# Patient Record
Sex: Male | Born: 1955 | Race: White | Hispanic: No | Marital: Married | Smoking: Former smoker
Health system: Southern US, Community
[De-identification: ages and names within clinical notes are randomized; demographics above are authoritative.]

## PROBLEM LIST (undated history)

## (undated) DIAGNOSIS — D649 Anemia, unspecified: Secondary | ICD-10-CM

## (undated) DIAGNOSIS — T8859XA Other complications of anesthesia, initial encounter: Secondary | ICD-10-CM

## (undated) DIAGNOSIS — T4145XA Adverse effect of unspecified anesthetic, initial encounter: Secondary | ICD-10-CM

## (undated) DIAGNOSIS — C801 Malignant (primary) neoplasm, unspecified: Secondary | ICD-10-CM

## (undated) DIAGNOSIS — I609 Nontraumatic subarachnoid hemorrhage, unspecified: Secondary | ICD-10-CM

## (undated) DIAGNOSIS — M1990 Unspecified osteoarthritis, unspecified site: Secondary | ICD-10-CM

## (undated) DIAGNOSIS — R112 Nausea with vomiting, unspecified: Secondary | ICD-10-CM

## (undated) DIAGNOSIS — E785 Hyperlipidemia, unspecified: Secondary | ICD-10-CM

## (undated) DIAGNOSIS — Z9889 Other specified postprocedural states: Secondary | ICD-10-CM

## (undated) DIAGNOSIS — K219 Gastro-esophageal reflux disease without esophagitis: Secondary | ICD-10-CM

## (undated) DIAGNOSIS — Z8489 Family history of other specified conditions: Secondary | ICD-10-CM

## (undated) DIAGNOSIS — R42 Dizziness and giddiness: Secondary | ICD-10-CM

## (undated) DIAGNOSIS — E119 Type 2 diabetes mellitus without complications: Secondary | ICD-10-CM

## (undated) HISTORY — PX: COLONOSCOPY: SHX174

## (undated) HISTORY — PX: MENISCUS REPAIR: SHX5179

## (undated) HISTORY — DX: Hyperlipidemia, unspecified: E78.5

## (undated) HISTORY — DX: Type 2 diabetes mellitus without complications: E11.9

---

## 2004-01-10 HISTORY — PX: KNEE ARTHROSCOPY: SUR90

## 2010-05-20 ENCOUNTER — Ambulatory Visit: Payer: Self-pay | Admitting: Gastroenterology

## 2010-05-21 LAB — PATHOLOGY REPORT

## 2013-11-11 HISTORY — PX: KNEE ARTHROSCOPY: SUR90

## 2016-07-14 DIAGNOSIS — C801 Malignant (primary) neoplasm, unspecified: Secondary | ICD-10-CM

## 2016-07-14 HISTORY — DX: Malignant (primary) neoplasm, unspecified: C80.1

## 2016-10-13 ENCOUNTER — Ambulatory Visit: Payer: BC Managed Care – PPO | Admitting: Urology

## 2016-10-13 ENCOUNTER — Encounter: Payer: Self-pay | Admitting: Urology

## 2016-10-13 VITALS — BP 134/78 | HR 54 | Ht 66.0 in | Wt 189.3 lb

## 2016-10-13 DIAGNOSIS — R972 Elevated prostate specific antigen [PSA]: Secondary | ICD-10-CM

## 2016-10-13 MED ORDER — SULFAMETHOXAZOLE-TRIMETHOPRIM 800-160 MG PO TABS: 1.0000 | ORAL_TABLET | Freq: Two times a day (BID) | ORAL | 0 refills | Status: AC

## 2016-10-13 NOTE — Progress Notes (Signed)
   10/13/2016 6:02 AM   Keith Holland 01/19/1956 [id number]  Referring provider: Ezequiel Kayser, MD [facility] [place], [place] [address]  CC: new patient elevated PSA  HPI:  1. Elevated PSA - PSA 9.52 09/2016 by PCP labs. No others avail for comparison. Pt's father with prostate cancer. DRE 09/2016 30gm with diffuse Rt induration, not fixed.    PMH sig for IDDM2, knee scope, SAH (spontaneous, no deficits). 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 His PCP is Ezequiel Kayser MD with [facility].  Today " [name] " is seen as new patient for above. He is referred by Dr. Raechel Ache.    PMH: IDDM2 SAH  Surgical History: Knee scope  Home Medications:  Allergies as of 10/13/2016   Not on File     Medication List    as of 10/13/2016  6:02 AM   You have not been prescribed any medications.     Allergies: Allergies not on file  Family History: No family history on file.  Social History:  has no tobacco, alcohol, and drug history on file.   Review of Systems  Gastrointestinal (upper)  : Negative for upper GI symptoms  Gastrointestinal (lower) : Negative for lower GI symptoms  Constitutional : Negative for symptoms  Skin: Negative for skin symptoms  Eyes: Negative for eye symptoms  Ear/Nose/Throat : Negative for Ear/Nose/Throat symptoms  Hematologic/Lymphatic: Negative for Hematologic/Lymphatic symptoms  Cardiovascular : Negative for cardiovascular symptoms  Respiratory : Negative for respiratory symptoms  Endocrine: Negative for endocrine symptoms  Musculoskeletal: Negative for musculoskeletal symptoms  Neurological: Negative for neurological symptoms  Psychologic: Negative for psychiatric symptoms  Physical Exam: There were no vitals taken for this visit.  Constitutional:  Alert and oriented, No acute distress. HEENT: [place] AT, moist mucus membranes.  Trachea midline, no  masses. Cardiovascular: No clubbing, cyanosis, or edema. Respiratory: Normal respiratory effort, no increased work of breathing. GI: Abdomen is soft, nontender, nondistended, no abdominal masses GU: No CVA tenderness. NO scrotal masses. Straight phallus. 30gm prostate with diffuse Right induration. Non-fixed.  Skin: No rashes, bruises or suspicious lesions. Lymph: No cervical or inguinal adenopathy. Neurologic: Grossly intact, no focal deficits, moving all 4 extremities. Psychiatric: Normal mood and affect.  Laboratory Data: No results found for: WBC, HGB, HCT, MCV, PLT  No results found for: CREATININE  No results found for: PSA  No results found for: TESTOSTERONE  No results found for: HGBA1C  Urinalysis No results found for: COLORURINE, APPEARANCEUR, LABSPEC, PHURINE, GLUCOSEU, HGBUR, BILIRUBINUR, KETONESUR, PROTEINUR, UROBILINOGEN, NITRITE, LEUKOCYTESUR  Pertinent Imaging: - none  Assessment & Plan:   1. Elevated PSA - significant elevation in younger man with stable comorbidity. His exam is also abnormal. DDX benign and malignant etiologies discussed as well as natural history of prostate cancer. Management options including serial labs, genetic tests, imaging, biopsy (only test that can DX caner) discussed in order of increasing agressiveness. He opts for biopsy next available and I agree. Risks, benefits, expected peri-BX course discussed. Bactrim peri-BX RX'd today.    Alexis Frock, [facility] [address], [place], [place] [address] [phone]

## 2016-11-11 ENCOUNTER — Other Ambulatory Visit: Payer: Self-pay | Admitting: Urology

## 2016-11-11 ENCOUNTER — Encounter: Payer: Self-pay | Admitting: Urology

## 2016-11-11 ENCOUNTER — Ambulatory Visit: Payer: BC Managed Care – PPO | Admitting: Urology

## 2016-11-11 VITALS — BP 138/76 | HR 63 | Ht 66.0 in | Wt 187.2 lb

## 2016-11-11 DIAGNOSIS — R972 Elevated prostate specific antigen [PSA]: Secondary | ICD-10-CM

## 2016-11-11 MED ORDER — GENTAMICIN SULFATE 40 MG/ML IJ SOLN
80.0000 mg | Freq: Once | INTRAMUSCULAR | Status: AC
  Administered 2016-11-11: 80 mg via INTRAMUSCULAR

## 2016-11-11 NOTE — Addendum Note (Signed)
Addended by: Wilson Singer on: 11/11/2016 09:28 AM   Modules accepted: Orders

## 2016-11-11 NOTE — Progress Notes (Signed)
11/11/2016 6:02 AM   Venia Carbon Knaggs 03-25-1956 [id number]  Referring provider: Ezequiel Kayser, MD [facility] [place], [place] [address]  CC: Prostate Biopsy  HPI:  1. Elevated PSA - PSA 9.52 09/2016 by PCP labs. No others avail for comparison. Pt's father with prostate cancer. DRE 09/2016 30gm with diffuse Rt induration, not fixed. ==> TRUS Biopsy 10/2016 23 mL w/o median lobe.Marland Kitchen   PMH sig for IDDM2, knee scope, SAH (spontaneous, no deficits). 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 His PCP is Ezequiel Kayser MD with [facility].  Today " [name] " is seen to proceed with prostate biopsy. He took ABX and enema as RX'd.    PMH: Past Medical History:  Diagnosis Date  . Diabetes mellitus without complication ([place])   . Hyperlipidemia     Surgical History: Past Surgical History:  Procedure Laterality Date  . MENISCUS REPAIR      Home Medications:  Allergies as of 11/11/2016   No Known Allergies     Medication List       Accurate as of 11/11/16  6:02 AM. Always use your most recent med list.          accu-chek multiclix lancets Use as instructed two times daily   ciclopirox 8 % solution Commonly known as:  PENLAC Apply topically.   COOL BLOOD GLUCOSE TEST STRIPS VI Use 2 (two) times daily. Use as instructed.   FARXIGA 10 MG Tabs tablet Generic drug:  dapagliflozin propanediol Take by mouth.   FIFTY50 PEN NEEDLES 31G X 8 MM Misc Generic drug:  Insulin Pen Needle Use as directed. Use one daily.   metFORMIN 500 MG 24 hr tablet Commonly known as:  GLUCOPHAGE-XR Take by mouth.   naproxen sodium 220 MG tablet Commonly known as:  ANAPROX Take by mouth.   omeprazole 20 MG capsule Commonly known as:  PRILOSEC Take by mouth.   pioglitazone 30 MG tablet Commonly known as:  ACTOS Take by mouth.   simvastatin 40 MG tablet Commonly known as:  ZOCOR Take by mouth.   TOUJEO SOLOSTAR 300 UNIT/ML  Sopn Generic drug:  Insulin Glargine Inject into the skin.       Allergies: No Known Allergies  Family History: Family History  Problem Relation Age of Onset  . Prostate cancer Father   . Stroke Father   . Stroke Mother   . Bladder Cancer Neg Hx   . Kidney cancer Neg Hx     Social History:  reports that he has never smoked. He has quit using smokeless tobacco. His smokeless tobacco use included Chew. He reports that he drinks alcohol. He reports that he does not use drugs.      Review of Systems  Gastrointestinal (upper)  : Negative for upper GI symptoms  Gastrointestinal (lower) : Negative for lower GI symptoms  Constitutional : Negative for symptoms  Skin: Negative for skin symptoms  Eyes: Negative for eye symptoms  Ear/Nose/Throat : Negative for Ear/Nose/Throat symptoms  Hematologic/Lymphatic: Negative for Hematologic/Lymphatic symptoms  Cardiovascular : Negative for cardiovascular symptoms  Respiratory : Negative for respiratory symptoms  Endocrine: Negative for endocrine symptoms  Musculoskeletal: Negative for musculoskeletal symptoms  Neurological: Negative for neurological symptoms  Psychologic: Negative for psychiatric symptoms  Physical Exam: There were no vitals taken for this visit.  Constitutional:  Alert and oriented, No acute distress. HEENT: [place] AT, moist mucus membranes.  Trachea midline, no masses. Cardiovascular: No clubbing, cyanosis, or  edema. Respiratory: Normal respiratory effort, no increased work of breathing. GI: Abdomen is soft, nontender, nondistended, no abdominal masses GU: No CVA tenderness.  Skin: No rashes, bruises or suspicious lesions. Lymph: No cervical or inguinal adenopathy. Neurologic: Grossly intact, no focal deficits, moving all 4 extremities. Psychiatric: Normal mood and affect.   Prostate Biopsy Procedure   Informed consent was obtained after discussing risks/benefits of the procedure.  A time  out was performed to ensure correct patient identity.  Pre-Procedure: - Last PSA Level: No results found for: PSA - Gentamicin given prophylactically - Levaquin 500 mg administered PO -Transrectal Ultrasound performed revealing a 23 gm prostate -No significant hypoechoic or median lobe noted  Procedure: - Prostate block performed using 10 cc 1% lidocaine and biopsies taken from sextant areas, a total of 12 under ultrasound guidance.  Post-Procedure: - Patient tolerated the procedure well - He was counseled to seek immediate medical attention if experiences any severe pain, significant bleeding, or fevers - Return in one week to discuss biopsy results   Assessment & Plan:   1. Elevated PSA - s/p BX today as per above.Warned to contact MD for new fevers, inability to void, or large bleeding with dizziness.  Alexis Frock, [facility] [address], [place], [place] [address] [phone]

## 2016-11-15 LAB — PATHOLOGY REPORT

## 2016-11-17 ENCOUNTER — Other Ambulatory Visit: Payer: Self-pay | Admitting: Urology

## 2016-11-30 DIAGNOSIS — C61 Malignant neoplasm of prostate: Secondary | ICD-10-CM | POA: Insufficient documentation

## 2016-11-30 NOTE — Progress Notes (Signed)
12/02/2016 1:42 PM   Keith Holland [birth year] [id number]  Referring provider: Ezequiel Kayser, MD [place] [facility] [place], [place] [address]  CC: Discuss New Prostate Cancer  HPI:  1. Moderate Risk Prostate Cancer - Gleason 3+3=6 in up to 10% of LMM, RMB, RMA by TRUS biopsy 11/2016 on eval PSA 9.52 and Rt induration (T2a).  Pt's father with prostate cancer. DRE 09/2016 30gm with diffuse Rt induration, not fixed. ==> TRUS Biopsy 10/2016 23 mL w/o median lobe.  PMH sig for IDDM2, knee scope, SAH (spontaneous, no deficits). 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 His PCP is Keith Kayser MD with [facility].  Today " Keith Holland " is seen in f/u above and discuss new prostate cancer.    PMH: Past Medical History:  Diagnosis Date  . Diabetes mellitus without complication ([place])   . Hyperlipidemia     Surgical History: Past Surgical History:  Procedure Laterality Date  . MENISCUS REPAIR      Home Medications:  Allergies as of 12/02/2016   No Known Allergies     Medication List       Accurate as of 11/30/16  1:42 PM. Always use your most recent med list.          accu-chek multiclix lancets Use as instructed two times daily   ciclopirox 8 % solution Commonly known as:  PENLAC Apply topically.   COOL BLOOD GLUCOSE TEST STRIPS VI Use 2 (two) times daily. Use as instructed.   FARXIGA 10 MG Tabs tablet Generic drug:  dapagliflozin propanediol Take by mouth.   FIFTY50 PEN NEEDLES 31G X 8 MM Misc Generic drug:  Insulin Pen Needle Use as directed. Use one daily.   metFORMIN 500 MG 24 hr tablet Commonly known as:  GLUCOPHAGE-XR Take by mouth.   naproxen sodium 220 MG tablet Commonly known as:  ANAPROX Take by mouth.   omeprazole 20 MG capsule Commonly known as:  PRILOSEC Take by mouth.   pioglitazone 30 MG tablet Commonly known as:  ACTOS Take by mouth.   simvastatin 40 MG tablet Commonly known as:   ZOCOR Take by mouth.   TOUJEO SOLOSTAR 300 UNIT/ML Sopn Generic drug:  Insulin Glargine Inject into the skin.       Allergies: No Known Allergies  Family History: Family History  Problem Relation Age of Onset  . Prostate cancer Father   . Stroke Father   . Stroke Mother   . Bladder Cancer Neg Hx   . Kidney cancer Neg Hx     Social History:  reports that he has never smoked. He has quit using smokeless tobacco. His smokeless tobacco use included Chew. He reports that he drinks alcohol. He reports that he does not use drugs.      Review of Systems  Gastrointestinal (upper)  : Negative for upper GI symptoms  Gastrointestinal (lower) : Negative for lower GI symptoms  Constitutional : Negative for symptoms  Skin: Negative for skin symptoms  Eyes: Negative for eye symptoms  Ear/Nose/Throat : Negative for Ear/Nose/Throat symptoms  Hematologic/Lymphatic: Negative for Hematologic/Lymphatic symptoms  Cardiovascular : Negative for cardiovascular symptoms  Respiratory : Negative for respiratory symptoms  Endocrine: Negative for endocrine symptoms  Musculoskeletal: Negative for musculoskeletal symptoms  Neurological: Negative for neurological symptoms  Psychologic: Negative for psychiatric symptoms  Physical Exam: There were no vitals taken for this visit.  Constitutional:  Alert and oriented, No acute distress. HEENT: [place] AT, moist mucus  membranes.  Trachea midline, no masses. Cardiovascular: No clubbing, cyanosis, or edema. Respiratory: Normal respiratory effort, no increased work of breathing. GI: Abdomen is soft, nontender, nondistended, no abdominal masses GU: No CVA tenderness.  Skin: No rashes, bruises or suspicious lesions. Lymph: No cervical or inguinal adenopathy. Neurologic: Grossly intact, no focal deficits, moving all 4 extremities. Psychiatric: Normal mood and affect.      Assessment & Plan:   1. Moderate Risk Prostate Cancer -  Discussed natural history of moderate risk disease with real possibility of progression to symptomatic disease over years-decades. Management options including surveillance, radiaion, surgery discussed in detail including frank discussion of expected urianry and sexual side effects with any curative intent therapy. Offered and encouraged rad-onc discussion as well, as may be candidate for brachy by risk and prostate size criteria, though I usually favor for men >65 given risk of secondary malignancy.  He opts for rad-onc visit and rediscuss. I agree. He would likely do well with surgery or radiation.    Keith [place], [facility] [address], [place], [place] [address] [phone]

## 2016-12-02 ENCOUNTER — Ambulatory Visit: Payer: BC Managed Care – PPO | Admitting: Urology

## 2016-12-02 ENCOUNTER — Encounter: Payer: Self-pay | Admitting: Urology

## 2016-12-02 VITALS — BP 132/78 | HR 62 | Ht 66.0 in | Wt 190.6 lb

## 2016-12-02 DIAGNOSIS — C61 Malignant neoplasm of prostate: Secondary | ICD-10-CM

## 2016-12-04 ENCOUNTER — Telehealth: Payer: Self-pay | Admitting: Urology

## 2016-12-04 NOTE — Telephone Encounter (Signed)
Received a call from [facility] at the cancer center and she spoke with the patient and he declined an app with them stating that he wanted to proceed with surgery instead.  Sharyn Lull

## 2016-12-15 NOTE — Progress Notes (Signed)
12/16/2016 9:54 AM   Keith Holland 1955/11/23 [id number]  Referring provider: Ezequiel Kayser, MD [facility] [place], [place] [address]  CC: [place] Management of Prostate Cancer  HPI:  1. Moderate Risk Prostate Cancer - Gleason 3+3=6 in up to 10% of LMM, RMB, RMA by TRUS biopsy 11/2016 on eval PSA 9.52 and Rt induration (T2a).  Pt's father with prostate cancer. DRE 09/2016 30gm with diffuse Rt induration, not fixed. ==> TRUS Biopsy 10/2016 23 mL w/o median lobe.  PMH sig for IDDM2, knee scope, SAH (spontaneous, no deficits). 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 His PCP is Ezequiel Kayser MD with [facility].  Today " [name] " is seen in f/u above. We have previously discussed primary managmetn options for his prostate cancer in detail.    PMH: Past Medical History:  Diagnosis Date  . Diabetes mellitus without complication ([place])   . Hyperlipidemia     Surgical History: Past Surgical History:  Procedure Laterality Date  . MENISCUS REPAIR     x 3    Home Medications:  Allergies as of 12/16/2016   No Known Allergies     Medication List       Accurate as of 12/15/16  9:54 AM. Always use your most recent med list.          accu-chek multiclix lancets Use as instructed two times daily   ciclopirox 8 % solution Commonly known as:  PENLAC Apply topically.   COOL BLOOD GLUCOSE TEST STRIPS VI Use 2 (two) times daily. Use as instructed.   etodolac 500 MG tablet Commonly known as:  LODINE etodolac 500 mg tablet   FARXIGA 10 MG Tabs tablet Generic drug:  dapagliflozin propanediol Take by mouth.   FIFTY50 PEN NEEDLES 31G X 8 MM Misc Generic drug:  Insulin Pen Needle Use as directed. Use one daily.   glipiZIDE 10 MG 24 hr tablet Commonly known as:  GLUCOTROL XL glipizide ER 10 mg tablet, extended release 24 hr   INVOKANA 300 MG Tabs tablet Generic drug:  canagliflozin Invokana 300 mg tablet     metFORMIN 500 MG 24 hr tablet Commonly known as:  GLUCOPHAGE-XR Take by mouth.   naproxen sodium 220 MG tablet Commonly known as:  ANAPROX Take by mouth.   omeprazole 20 MG capsule Commonly known as:  PRILOSEC Take by mouth.   ONGLYZA 5 MG Tabs tablet Generic drug:  saxagliptin HCl Onglyza 5 mg tablet   pioglitazone 30 MG tablet Commonly known as:  ACTOS Take by mouth.   simvastatin 40 MG tablet Commonly known as:  ZOCOR Take by mouth.   TOUJEO SOLOSTAR 300 UNIT/ML Sopn Generic drug:  Insulin Glargine Inject into the skin.   traMADol 50 MG tablet Commonly known as:  ULTRAM tramadol 50 mg tablet  Take 1 tablet every 6 hours by oral route as needed.   VICODIN 5-300 MG Tabs Generic drug:  Hydrocodone-Acetaminophen Vicodin 5 mg-300 mg tablet  Take 1 tablet every 4-6 hours by oral route as needed.       Allergies: No Known Allergies  Family History: Family History  Problem Relation Age of Onset  . Prostate cancer Father   . Stroke Father   . Stroke Mother   . Bladder Cancer Neg Hx   . Kidney cancer Neg Hx     Social History:  reports that he has never smoked. He has quit using smokeless tobacco. His smokeless tobacco use  included Chew. He reports that he drinks alcohol. He reports that he does not use drugs.      Review of Systems  Gastrointestinal (upper)  : Negative for upper GI symptoms  Gastrointestinal (lower) : Negative for lower GI symptoms  Constitutional : Negative for symptoms  Skin: Negative for skin symptoms  Eyes: Negative for eye symptoms  Ear/Nose/Throat : Negative for Ear/Nose/Throat symptoms  Hematologic/Lymphatic: Negative for Hematologic/Lymphatic symptoms  Cardiovascular : Negative for cardiovascular symptoms  Respiratory : Negative for respiratory symptoms  Endocrine: Negative for endocrine symptoms  Musculoskeletal: Negative for musculoskeletal symptoms  Neurological: Negative for neurological  symptoms  Psychologic: Negative for psychiatric symptoms  Physical Exam: There were no vitals taken for this visit.  Constitutional:  Alert and oriented, No acute distress. HEENT: [place] AT, moist mucus membranes.  Trachea midline, no masses. Cardiovascular: No clubbing, cyanosis, or edema. Respiratory: Normal respiratory effort, no increased work of breathing. GI: Abdomen is soft, nontender, nondistended, no abdominal masses GU: No CVA tenderness.  Skin: No rashes, bruises or suspicious lesions. Lymph: No cervical or inguinal adenopathy. Neurologic: Grossly intact, no focal deficits, moving all 4 extremities. Psychiatric: Normal mood and affect.      Assessment & Plan:   1. Moderate Risk Prostate Cancer -  Pt opts for curative intent prostatectomy. I agree given young age and sell controlled comorbidity. Risks, benefits, expected peri-op course discussed. Also frankly discussed expected urinary and sexual function changes that can improve with time but not back to baseline.  PT eval prior to hasten continence recovery.    Alexis Frock, [facility] [address] [place], [place] [address] [phone]

## 2016-12-16 ENCOUNTER — Ambulatory Visit: Payer: BC Managed Care – PPO | Admitting: Urology

## 2016-12-16 ENCOUNTER — Encounter: Payer: Self-pay | Admitting: Urology

## 2016-12-16 VITALS — BP 135/74 | HR 62 | Ht 66.0 in | Wt 193.3 lb

## 2016-12-16 DIAGNOSIS — C61 Malignant neoplasm of prostate: Secondary | ICD-10-CM | POA: Diagnosis not present

## 2016-12-17 ENCOUNTER — Institutional Professional Consult (permissible substitution): Payer: Self-pay | Admitting: Radiation Oncology

## 2016-12-17 ENCOUNTER — Telehealth: Payer: Self-pay | Admitting: Radiology

## 2016-12-17 ENCOUNTER — Other Ambulatory Visit: Payer: Self-pay | Admitting: Radiology

## 2016-12-17 DIAGNOSIS — C61 Malignant neoplasm of prostate: Secondary | ICD-10-CM

## 2016-12-17 NOTE — Telephone Encounter (Signed)
Notified pt of prostatectomy scheduled with Dr Erlene Quan on 12/29/16, pre-admit testing appt on 12/18/16 @1 :00 & to call Friday prior to surgery for arrival time to SDS. Advised pt to be npo after mn prior to surgery. Questions answered to pt's satisfaction. No further questions at this time. Pt voices understanding.

## 2016-12-18 ENCOUNTER — Encounter
Admission: RE | Admit: 2016-12-18 | Discharge: 2016-12-18 | Disposition: A | Discharge status: Home / Self Care | Payer: BC Managed Care – PPO | Source: Ambulatory Visit | Attending: Urology | Admitting: Urology

## 2016-12-18 DIAGNOSIS — Z01812 Encounter for preprocedural laboratory examination: Secondary | ICD-10-CM | POA: Insufficient documentation

## 2016-12-18 DIAGNOSIS — C61 Malignant neoplasm of prostate: Secondary | ICD-10-CM | POA: Diagnosis not present

## 2016-12-18 DIAGNOSIS — E119 Type 2 diabetes mellitus without complications: Secondary | ICD-10-CM | POA: Diagnosis not present

## 2016-12-18 DIAGNOSIS — Z01818 Encounter for other preprocedural examination: Secondary | ICD-10-CM | POA: Diagnosis not present

## 2016-12-18 DIAGNOSIS — Z0183 Encounter for blood typing: Secondary | ICD-10-CM | POA: Insufficient documentation

## 2016-12-18 HISTORY — DX: Nausea with vomiting, unspecified: R11.2

## 2016-12-18 HISTORY — DX: Other complications of anesthesia, initial encounter: T88.59XA

## 2016-12-18 HISTORY — DX: Adverse effect of unspecified anesthetic, initial encounter: T41.45XA

## 2016-12-18 HISTORY — DX: Dizziness and giddiness: R42

## 2016-12-18 HISTORY — DX: Nontraumatic subarachnoid hemorrhage, unspecified: I60.9

## 2016-12-18 HISTORY — DX: Gastro-esophageal reflux disease without esophagitis: K21.9

## 2016-12-18 HISTORY — DX: Unspecified osteoarthritis, unspecified site: M19.90

## 2016-12-18 HISTORY — DX: Other specified postprocedural states: Z98.890

## 2016-12-18 LAB — CBC
HCT: 44.6 % (ref 40.0–52.0)
Hemoglobin: 14.7 g/dL (ref 13.0–18.0)
MCH: 30.2 pg (ref 26.0–34.0)
MCHC: 32.9 g/dL (ref 32.0–36.0)
MCV: 91.8 fL (ref 80.0–100.0)
PLATELETS: 207 10*3/uL (ref 150–440)
RBC: 4.86 MIL/uL (ref 4.40–5.90)
RDW: 13.8 % (ref 11.5–14.5)
WBC: 5.6 10*3/uL (ref 3.8–10.6)

## 2016-12-18 LAB — TYPE AND SCREEN
ABO/RH(D): O POS
Antibody Screen: NEGATIVE

## 2016-12-18 LAB — URINALYSIS, ROUTINE W REFLEX MICROSCOPIC
Bacteria, UA: NONE SEEN
Bilirubin Urine: NEGATIVE
Hgb urine dipstick: NEGATIVE
KETONES UR: NEGATIVE mg/dL
LEUKOCYTES UA: NEGATIVE
Nitrite: NEGATIVE
PROTEIN: NEGATIVE mg/dL
SQUAMOUS EPITHELIAL / LPF: NONE SEEN
Specific Gravity, Urine: 1.024 (ref 1.005–1.030)
pH: 5 (ref 5.0–8.0)

## 2016-12-18 LAB — BASIC METABOLIC PANEL
Anion gap: 7 (ref 5–15)
BUN: 21 mg/dL — AB (ref 6–20)
CALCIUM: 8.9 mg/dL (ref 8.9–10.3)
CHLORIDE: 104 mmol/L (ref 101–111)
CO2: 25 mmol/L (ref 22–32)
CREATININE: 0.77 mg/dL (ref 0.61–1.24)
GFR calc Af Amer: >60 mL/min (ref >60)
GFR calc non Af Amer: >60 mL/min (ref >60)
Glucose, Bld: 196 mg/dL — ABNORMAL HIGH (ref 65–99)
Potassium: 4.1 mmol/L (ref 3.5–5.1)
Sodium: 136 mmol/L (ref 135–145)

## 2016-12-18 NOTE — Pre-Procedure Instructions (Signed)
PATIENT STATES MAIN CONCERN IS SEVERE N/V POST OP

## 2016-12-18 NOTE — Patient Instructions (Addendum)
  Your procedure is scheduled on: 12/29/16 Report to Same Day Surgery 2nd floor medical mall [facility] Entrance-take elevator on left to 2nd floor.  Check in with surgery information desk.) To find out your arrival time please call [phone] between 1PM - 3PM on 12/26/16  Remember: Instructions that are not followed completely may result in serious medical risk, up to and including death, or upon the discretion of your surgeon and anesthesiologist your surgery may need to be rescheduled.    _x___ 1. Do not eat food or drink liquids after midnight. No gum chewing or                              hard candies.     __x__ 2. No Alcohol for 24 hours before or after surgery.   __x__3. No Smoking for 24 prior to surgery.   ____  4. Bring all medications with you on the day of surgery if instructed.    __x__ 5. Notify your doctor if there is any change in your medical condition     (cold, fever, infections).     Do not wear jewelry, make-up, hairpins, clips or nail polish.  Do not wear lotions, powders, or perfumes. You may wear deodorant.  Do not shave 48 hours prior to surgery. Men may shave face and neck.  Do not bring valuables to the hospital.    [facility] is not responsible for any belongings or valuables.               Contacts, dentures or bridgework may not be worn into surgery.  Leave your suitcase in the car. After surgery it may be brought to your room.  For patients admitted to the hospital, discharge time is determined by your                       treatment team.   Patients discharged the day of surgery will not be allowed to drive home.  You will need someone to drive you home and stay with you the night of your procedure.    Please read over the following fact sheets that you were given:   [facility] Preparing for Surgery   _x___ Take anti-hypertensive (unless it includes a diuretic), cardiac, seizure, asthma,     anti-reflux and psychiatric medicines. These  include:  1. PRILOSEC AT BEDTIME AND MORNING OF SURGERY  2.  3.  4.  5.  6.  ____Fleets enema or Magnesium Citrate as directed.   ____ Use CHG Soap or sage wipes as directed on instruction sheet   ____ Use inhalers on the day of surgery and bring to hospital day of surgery  _X___ Stop Metformin and Janumet 2 days prior to surgery.    __X__ Take 1/2 of usual insulin dose the night before surgery and none on the morning     surgery.   _x___ Follow recommendations from Cardiologist, Pulmonologist or PCP regarding stopping Aspirin, Coumadin, Pllavix ,Eliquis, Effient, or Pradaxa, and Pletal.  X____Stop Anti-inflammatories such as Advil, Aleve, Ibuprofen, Motrin, Naproxen, Naprosyn, Goodies powders or aspirin products. OK to take Tylenol and Celebrex.   _x___ Stop supplements until after surgery.  But may continue Vitamin D, Vitamin B, and multivitamin.   ____ Bring C-Pap to the hospital.

## 2016-12-19 LAB — URINE CULTURE: CULTURE: NO GROWTH

## 2016-12-22 ENCOUNTER — Ambulatory Visit: Payer: BC Managed Care – PPO | Attending: Urology | Admitting: Physical Therapy

## 2016-12-22 DIAGNOSIS — R278 Other lack of coordination: Secondary | ICD-10-CM | POA: Insufficient documentation

## 2016-12-22 DIAGNOSIS — R2689 Other abnormalities of gait and mobility: Secondary | ICD-10-CM | POA: Insufficient documentation

## 2016-12-22 NOTE — Patient Instructions (Addendum)
    Decrease bladder irritants ( soft drink, coffee), start drink 2 _8 fl oz of water  __________   Maintain spinal flexibility for low back pain and arthritis  Arm swings without moving the hips / knees Side bend  Mini squat, chest lift,   3 x aside side every morning , mid after noon at work     __________   Functional activities to decrease load on pelvic floor mm    Proper body mechanics with getting out of a chair to decrease strain  on back &pelvic floor   Avoid holding your breath when Getting out of the chair:  Scoot to front part of chair Heels behind feet, feet are hip width apart, nose over toes  Inhale like you are smelling roses Exhale to stand     Up from the floor from all fours, buttock is up in the air, crawl feet forward and crawl hand on thighs to stand up      ___________   PELVIC FLOOR / KEGEL EXERCISES  TO DO [place] and can start after CATHETER IS REMOVED    Pelvic floor/ Kegel exercises are used to strengthen the muscles in the base of your pelvis that are responsible for supporting your pelvic organs and preventing urine/feces leakage. Based on your therapist's recommendations, they can be performed while standing, sitting, or lying down.  Make yourself aware of this muscle group by using these cues:  Imagine you are in a crowded room and you feel the need to pass gas. Your response is to pull up and in at the rectum.  Close the rectum. Pull the muscles up inside your body,feeling your scrotum lifting as well . Feel the pelvic floor muscles lift as if you were walking into a cold lake.  Place your hand on top of your pubic bone. Tighten and draw in the muscles around the anal muscles without squeezing the buttock muscles.  Common Errors:  Breath holding: If you are holding your breath, you may be bearing down against your bladder instead of pulling it up. If you belly bulges up while you are squeezing, you are holding  your breath. Be sure to breathe gently in and out while exercising. Counting out loud may help you avoid holding your breath.  Accessory muscle use: You should not see or feel other muscle movement when performing pelvic floor exercises. When done properly, no one can tell that you are performing the exercises. Keep the buttocks, belly and inner thighs relaxed.  Overdoing it: Your muscles can fatigue and stop working for you if you over-exercise. You may actually leak more or feel soreness at the lower abdomen or rectum.  YOUR HOME EXERCISE PROGRAM   LONG HOLDS:  ( wait until next PT session)    SHORT HOLDS: Position: on back reclined or seated    Inhale SMELL SOUP ( less in the chest rising, more in the ribcage expanding)  and then exhale. Then squeeze the muscle.  (Be sure to let belly sink in with exhales and not push outward)  Perform 5 repetitions, 5  Times/day  **ALSO SQUEEZE BEFORE YOUR SNEEZE, COUGH, LAUGH to decrease downward pressure   **ALSO EXHALE BEFORE YOU RISE AGAINST GRAVITY (lifting, sit to stand, from squat to stand)

## 2016-12-24 NOTE — Addendum Note (Signed)
Addended by: Jerl Mina on: 12/24/2016 12:59 PM   Modules accepted: Orders

## 2016-12-24 NOTE — Therapy (Addendum)
[place] MAIN [facility] SERVICES [address] [place], [place], [address] Phone: [phone]   Fax:  [phone]  Physical Therapy Evaluation  Patient Details  Name: Keith Holland MRN: [id number] Date of Birth: 07-02-1956 Referring Provider: Tresa Moore   Encounter Date: 12/22/2016      PT End of Session - 12/23/16 2357    Visit Number 1   Number of Visits 12   Date for PT Re-Evaluation 03/16/17   PT Start Time 1010   PT Stop Time 4008   PT Time Calculation (min) 55 min   Activity Tolerance Patient tolerated treatment well;No increased pain   Behavior During Therapy WFL for tasks assessed/performed      Past Medical History:  Diagnosis Date  . Arthritis   . Complication of anesthesia    severe n/v  . Diabetes mellitus without complication ([place])   . GERD (gastroesophageal reflux disease)   . Hyperlipidemia   . PONV (postoperative nausea and vomiting)   . SAH (subarachnoid hemorrhage) ([place])    spontaneous 2000 n surgery  . Vertigo    since childhood if on back long will get vertigo     Past Surgical History:  Procedure Laterality Date  . MENISCUS REPAIR     x 3    There were no vitals filed for this visit.       Subjective Assessment - 12/23/16 2303    Subjective 1) Urinary Sx: Pt is scheduled for prostate schedule on 12/29/16. Pt Pt has had some difficulty with initaiting urination and occasionally, a weak flow for a couple of years. Denied leakage. Daily water intake: very little water, 36-  48 fl oz of Diet pepsi and tea combined,  16 floz  coffee,  2) sexual dysfunction: Issues with erection  3) CLBP 2/2 falling off the ladder  2003 ( compression in L1),  lifting with a twist a heavy load ( 1980s).  Pt has under gone PT. for his back. Pt has not been able to get rid of his LBP. upper lumbar with a recent pulled mm on the R lowe back 8 months.  Currently 4-5/10. At worst 8/10,  sometimes radiating pain down LLE back of leg.  Pt is  an Clinical biochemist , climbs ladders, get from stand to floor, minior lifting.                 [facility] PT Assessment - 12/23/16 2331      Assessment   Medical Diagnosis prostate cancer   Referring Provider Manny      Precautions   Precautions None     Restrictions   Weight Bearing Restrictions No     Balance Screen   Has the patient fallen in the past 6 months No     Observation/Other Assessments   Observations slumped sitting     Coordination   Gross Motor Movements are Fluid and Coordinated --  lumbopelvic perturbation with leg movements in hooklying   Fine Motor Movements are Fluid and Coordinated --  chest breathing     Squat   Comments knee anterior to toes     Floor to Stand   Comments down ward facing dog position to mini squat (pt reported less knee pain)       ROM / Strength   AROM / PROM / Strength --  limited diaphragmatic exursion, spinal hypomobility     Palpation   Spinal mobility increased thoracic /paraspinal mm tensions  Objective measurements completed on examination: See above findings.          [place] Adult PT Treatment/Exercise - 12/23/16 2349      Therapeutic Activites    Therapeutic Activities --  see pt instructions     Neuro Re-ed    Neuro Re-ed Details  see pt instructions                PT Education - 12/23/16 2318    Education provided Yes   Education Details POC, anatomy/physiology, HEP, goals   Person(s) Educated Patient   Methods Explanation;Demonstration;Tactile cues;Verbal cues;Handout   Comprehension Returned demonstration;Verbalized understanding             PT Long Term Goals - 12/23/16 2358      PT LONG TERM GOAL #1   Title Pt will demo increased spinal mobility to optimize diaphragmatic excursion and pelvic floor activation and minimize urinary leakage.    Time 12   Period Weeks   Status achieved     PT LONG TERM GOAL #2   Title Pt will demo proper pelvic floor lengthening and  coordination and 7 quick contractions in supine and seated position to minimize risk for leakage  ( 6/12: 5 reps)    Time 12   Period Weeks   Status New     PT LONG TERM GOAL #3   Title Pt will demo proper body mechanics with floor to stand and sit to stand t/f with less strain on pelvic floor mm in order to perform work tasks    Time 12   Period Weeks    Achieved            Plan - 12/23/16 2357    Clinical Impression Statement  Pt is a 61    yo male who is scheduled for prostatectomy on 6/18 /18. Pt was referred to Pelvic [facility] PT to train his pelvic floor mm to optimize continence post-surgery. Pt presented with limited spinal/ diaphragmatic/ pelvic floor mobility, weak pelvic floor mm, and poor body mechanics which place strain on his pelvic floor. Pt was educated on how to strengthen his mm and how to coordinate his mm during against gravity activities with quick contractions to minimize leakage. Pt demo'd properly. Pt voiced understanding to not perform pelvic floor mm when catheterized post surgery.  Pt would benefit from further PT post -surgery if he presents with urinary issues.         Clinical Presentation Stable   Clinical Decision Making Low   Rehab Potential Good   PT Frequency 1x / week   PT Duration 12 weeks   PT Treatment/Interventions ADLs/Self Care Home Management;Moist Heat;Therapeutic exercise;Therapeutic activities;Functional mobility training;Stair training;Gait training;Neuromuscular re-education;Patient/family education;Manual techniques;Energy conservation   Consulted and Agree with Plan of Care Patient      Patient will benefit from skilled therapeutic intervention in order to improve the following deficits and impairments:  Postural dysfunction, Improper body mechanics, Pain, Hypomobility, Decreased balance, Decreased safety awareness, Decreased activity tolerance, Decreased endurance, Decreased range of motion, Decreased coordination, Decreased  mobility  Visit Diagnosis: Other lack of coordination  Other abnormalities of gait and mobility     Problem List Patient Active Problem List   Diagnosis Date Noted  . Prostate cancer ([place]) 11/30/2016  . Elevated PSA 10/13/2016    Jerl Mina ,PT, DPT, E-RYT  12/24/2016, 12:00 AM  [place] MAIN [facility] SERVICES [address] [place], [place], [address] Phone: [phone]   Fax:  [phone]  Name: Keith Holland MRN: [id number] Date of Birth: 29-Jan-1956

## 2016-12-28 MED ORDER — CEFAZOLIN SODIUM-DEXTROSE 2-4 GM/100ML-% IV SOLN
2.0000 g | INTRAVENOUS | Status: AC
  Administered 2016-12-29: 2 g via INTRAVENOUS

## 2016-12-29 ENCOUNTER — Encounter: Payer: Self-pay | Admitting: *Deleted

## 2016-12-29 ENCOUNTER — Ambulatory Visit: Payer: BC Managed Care – PPO | Admitting: Anesthesiology

## 2016-12-29 ENCOUNTER — Observation Stay
Admission: RE | Admit: 2016-12-29 | Discharge: 2016-12-30 | Disposition: A | Discharge status: Home / Self Care | Payer: BC Managed Care – PPO | Source: Ambulatory Visit | Attending: Urology | Admitting: Urology

## 2016-12-29 ENCOUNTER — Encounter
Admission: RE | Disposition: A | Discharge status: Home / Self Care | Payer: Self-pay | Source: Ambulatory Visit | Attending: Urology

## 2016-12-29 DIAGNOSIS — Z823 Family history of stroke: Secondary | ICD-10-CM | POA: Insufficient documentation

## 2016-12-29 DIAGNOSIS — Z8679 Personal history of other diseases of the circulatory system: Secondary | ICD-10-CM | POA: Diagnosis not present

## 2016-12-29 DIAGNOSIS — E785 Hyperlipidemia, unspecified: Secondary | ICD-10-CM | POA: Insufficient documentation

## 2016-12-29 DIAGNOSIS — E119 Type 2 diabetes mellitus without complications: Secondary | ICD-10-CM | POA: Insufficient documentation

## 2016-12-29 DIAGNOSIS — Z87891 Personal history of nicotine dependence: Secondary | ICD-10-CM | POA: Insufficient documentation

## 2016-12-29 DIAGNOSIS — Z79899 Other long term (current) drug therapy: Secondary | ICD-10-CM | POA: Insufficient documentation

## 2016-12-29 DIAGNOSIS — Z794 Long term (current) use of insulin: Secondary | ICD-10-CM | POA: Diagnosis not present

## 2016-12-29 DIAGNOSIS — C61 Malignant neoplasm of prostate: Secondary | ICD-10-CM | POA: Diagnosis not present

## 2016-12-29 DIAGNOSIS — Z8042 Family history of malignant neoplasm of prostate: Secondary | ICD-10-CM | POA: Insufficient documentation

## 2016-12-29 DIAGNOSIS — Z791 Long term (current) use of non-steroidal anti-inflammatories (NSAID): Secondary | ICD-10-CM | POA: Insufficient documentation

## 2016-12-29 DIAGNOSIS — Z9889 Other specified postprocedural states: Secondary | ICD-10-CM | POA: Diagnosis not present

## 2016-12-29 HISTORY — PX: ROBOT ASSISTED LAPAROSCOPIC RADICAL PROSTATECTOMY: SHX5141

## 2016-12-29 LAB — CBC
HCT: 43.5 % (ref 40.0–52.0)
Hemoglobin: 14.6 g/dL (ref 13.0–18.0)
MCH: 30.7 pg (ref 26.0–34.0)
MCHC: 33.5 g/dL (ref 32.0–36.0)
MCV: 91.6 fL (ref 80.0–100.0)
PLATELETS: 194 10*3/uL (ref 150–440)
RBC: 4.75 MIL/uL (ref 4.40–5.90)
RDW: 13.7 % (ref 11.5–14.5)
WBC: 9.8 10*3/uL (ref 3.8–10.6)

## 2016-12-29 LAB — GLUCOSE, CAPILLARY
GLUCOSE-CAPILLARY: 191 mg/dL — AB (ref 65–99)
GLUCOSE-CAPILLARY: 222 mg/dL — AB (ref 65–99)
Glucose-Capillary: 167 mg/dL — ABNORMAL HIGH (ref 65–99)
Glucose-Capillary: 167 mg/dL — ABNORMAL HIGH (ref 65–99)

## 2016-12-29 LAB — ABO/RH: ABO/RH(D): O POS

## 2016-12-29 SURGERY — ROBOTIC ASSISTED LAPAROSCOPIC RADICAL PROSTATECTOMY
Anesthesia: General | Site: Prostate | Wound class: Clean Contaminated

## 2016-12-29 MED ORDER — ROCURONIUM BROMIDE 50 MG/5ML IV SOLN
INTRAVENOUS | Status: AC
  Filled 2016-12-29: qty 1

## 2016-12-29 MED ORDER — ONDANSETRON HCL 4 MG/2ML IJ SOLN
INTRAMUSCULAR | Status: DC | PRN
Start: 2016-12-29 — End: 2016-12-29
  Administered 2016-12-29: 4 mg via INTRAVENOUS

## 2016-12-29 MED ORDER — FENTANYL CITRATE (PF) 100 MCG/2ML IJ SOLN
25.0000 ug | INTRAMUSCULAR | Status: DC | PRN
  Administered 2016-12-29 (×3): 50 ug via INTRAVENOUS

## 2016-12-29 MED ORDER — DIPHENHYDRAMINE HCL 12.5 MG/5ML PO ELIX: 12.5000 mg | ORAL_SOLUTION | Freq: Four times a day (QID) | ORAL | Status: DC | PRN

## 2016-12-29 MED ORDER — SODIUM CHLORIDE 0.9 % IV BOLUS (SEPSIS)
1000.0000 mL | Freq: Once | INTRAVENOUS | Status: AC
  Administered 2016-12-29: 1000 mL via INTRAVENOUS

## 2016-12-29 MED ORDER — OXYBUTYNIN CHLORIDE 5 MG PO TABS: 5.0000 mg | ORAL_TABLET | Freq: Three times a day (TID) | ORAL | Status: DC | PRN

## 2016-12-29 MED ORDER — INSULIN ASPART 100 UNIT/ML ~~LOC~~ SOLN
4.0000 [IU] | Freq: Three times a day (TID) | SUBCUTANEOUS | Status: DC
Start: 2016-12-29 — End: 2016-12-30
  Administered 2016-12-29: 4 [IU] via SUBCUTANEOUS
  Filled 2016-12-29: qty 1

## 2016-12-29 MED ORDER — OXYCODONE HCL 5 MG/5ML PO SOLN: 5.0000 mg | Freq: Once | ORAL | Status: DC | PRN

## 2016-12-29 MED ORDER — FENTANYL CITRATE (PF) 100 MCG/2ML IJ SOLN
INTRAMUSCULAR | Status: AC
  Administered 2016-12-29: 50 ug via INTRAVENOUS
  Filled 2016-12-29: qty 2

## 2016-12-29 MED ORDER — PROPOFOL 10 MG/ML IV BOLUS
INTRAVENOUS | Status: DC | PRN
  Administered 2016-12-29: 20 mg via INTRAVENOUS
  Administered 2016-12-29: 150 mg via INTRAVENOUS

## 2016-12-29 MED ORDER — SUCCINYLCHOLINE CHLORIDE 20 MG/ML IJ SOLN
INTRAMUSCULAR | Status: AC
  Filled 2016-12-29: qty 1

## 2016-12-29 MED ORDER — MEPERIDINE HCL 50 MG/ML IJ SOLN: 6.2500 mg | INTRAMUSCULAR | Status: DC | PRN

## 2016-12-29 MED ORDER — LIDOCAINE HCL (PF) 2 % IJ SOLN
INTRAMUSCULAR | Status: AC
  Filled 2016-12-29: qty 2

## 2016-12-29 MED ORDER — SODIUM CHLORIDE 0.9 % IJ SOLN
INTRAMUSCULAR | Status: AC
  Filled 2016-12-29: qty 10

## 2016-12-29 MED ORDER — ROCURONIUM BROMIDE 100 MG/10ML IV SOLN
INTRAVENOUS | Status: DC | PRN
  Administered 2016-12-29: 10 mg via INTRAVENOUS
  Administered 2016-12-29 (×2): 20 mg via INTRAVENOUS
  Administered 2016-12-29: 50 mg via INTRAVENOUS
  Administered 2016-12-29: 10 mg via INTRAVENOUS

## 2016-12-29 MED ORDER — MAGNESIUM CITRATE PO SOLN
1.0000 | Freq: Once | ORAL | Status: DC
  Filled 2016-12-29: qty 296

## 2016-12-29 MED ORDER — DOCUSATE SODIUM 100 MG PO CAPS
100.0000 mg | ORAL_CAPSULE | Freq: Two times a day (BID) | ORAL | Status: DC
  Administered 2016-12-29 – 2016-12-30 (×2): 100 mg via ORAL
  Filled 2016-12-29 (×2): qty 1

## 2016-12-29 MED ORDER — MIDAZOLAM HCL 2 MG/2ML IJ SOLN
INTRAMUSCULAR | Status: DC | PRN
  Administered 2016-12-29: 2 mg via INTRAVENOUS

## 2016-12-29 MED ORDER — FENTANYL CITRATE (PF) 250 MCG/5ML IJ SOLN
INTRAMUSCULAR | Status: AC
  Filled 2016-12-29: qty 5

## 2016-12-29 MED ORDER — MORPHINE SULFATE (PF) 2 MG/ML IV SOLN: 2.0000 mg | INTRAVENOUS | Status: DC | PRN

## 2016-12-29 MED ORDER — LIDOCAINE 2% (20 MG/ML) 5 ML SYRINGE
INTRAMUSCULAR | Status: DC | PRN
  Administered 2016-12-29: 100 mg via INTRAVENOUS

## 2016-12-29 MED ORDER — DEXAMETHASONE SODIUM PHOSPHATE 10 MG/ML IJ SOLN
INTRAMUSCULAR | Status: DC | PRN
  Administered 2016-12-29: 10 mg via INTRAVENOUS

## 2016-12-29 MED ORDER — BUPIVACAINE HCL 0.5 % IJ SOLN
INTRAMUSCULAR | Status: DC | PRN
  Administered 2016-12-29: 15 mL

## 2016-12-29 MED ORDER — ONDANSETRON HCL 4 MG/2ML IJ SOLN
INTRAMUSCULAR | Status: AC
  Filled 2016-12-29: qty 2

## 2016-12-29 MED ORDER — SODIUM CHLORIDE 0.9 % IV SOLN
INTRAVENOUS | Status: DC
  Administered 2016-12-29: 07:00:00 via INTRAVENOUS

## 2016-12-29 MED ORDER — OXYCODONE-ACETAMINOPHEN 5-325 MG PO TABS
1.0000 | ORAL_TABLET | ORAL | Status: DC | PRN
  Filled 2016-12-29: qty 2

## 2016-12-29 MED ORDER — PROPOFOL 500 MG/50ML IV EMUL
INTRAVENOUS | Status: AC
  Filled 2016-12-29: qty 50

## 2016-12-29 MED ORDER — PROPOFOL 10 MG/ML IV BOLUS
INTRAVENOUS | Status: AC
  Filled 2016-12-29: qty 20

## 2016-12-29 MED ORDER — DEXAMETHASONE SODIUM PHOSPHATE 10 MG/ML IJ SOLN
INTRAMUSCULAR | Status: AC
  Filled 2016-12-29: qty 1

## 2016-12-29 MED ORDER — CEFAZOLIN SODIUM-DEXTROSE 1-4 GM/50ML-% IV SOLN
1.0000 g | Freq: Three times a day (TID) | INTRAVENOUS | Status: AC
Start: 2016-12-29 — End: 2016-12-30
  Administered 2016-12-29 (×2): 1 g via INTRAVENOUS
  Filled 2016-12-29 (×2): qty 50

## 2016-12-29 MED ORDER — INSULIN ASPART 100 UNIT/ML ~~LOC~~ SOLN: 0.0000 [IU] | Freq: Every day | SUBCUTANEOUS | Status: DC

## 2016-12-29 MED ORDER — PROMETHAZINE HCL 25 MG/ML IJ SOLN
6.2500 mg | INTRAMUSCULAR | Status: DC | PRN
  Administered 2016-12-29: 12.5 mg via INTRAVENOUS

## 2016-12-29 MED ORDER — SUGAMMADEX SODIUM 200 MG/2ML IV SOLN
INTRAVENOUS | Status: AC
  Filled 2016-12-29: qty 2

## 2016-12-29 MED ORDER — THROMBIN 5000 UNITS EX SOLR
CUTANEOUS | Status: DC | PRN
  Administered 2016-12-29: 5000 [IU] via TOPICAL

## 2016-12-29 MED ORDER — SUGAMMADEX SODIUM 200 MG/2ML IV SOLN
INTRAVENOUS | Status: DC | PRN
  Administered 2016-12-29: 175 mg via INTRAVENOUS

## 2016-12-29 MED ORDER — OXYCODONE HCL 5 MG PO TABS: 5.0000 mg | ORAL_TABLET | Freq: Once | ORAL | Status: DC | PRN

## 2016-12-29 MED ORDER — PROMETHAZINE HCL 25 MG/ML IJ SOLN
INTRAMUSCULAR | Status: AC
  Administered 2016-12-29: 12.5 mg via INTRAVENOUS
  Filled 2016-12-29: qty 1

## 2016-12-29 MED ORDER — CEFAZOLIN SODIUM-DEXTROSE 2-4 GM/100ML-% IV SOLN
INTRAVENOUS | Status: AC
  Filled 2016-12-29: qty 100

## 2016-12-29 MED ORDER — DEXMEDETOMIDINE HCL 200 MCG/2ML IV SOLN
INTRAVENOUS | Status: DC | PRN
  Administered 2016-12-29: 12 ug via INTRAVENOUS

## 2016-12-29 MED ORDER — TRAMADOL HCL 50 MG PO TABS
50.0000 mg | ORAL_TABLET | Freq: Four times a day (QID) | ORAL | Status: DC | PRN
Start: 2016-12-29 — End: 2016-12-30
  Administered 2016-12-29 – 2016-12-30 (×2): 50 mg via ORAL
  Filled 2016-12-29 (×2): qty 1

## 2016-12-29 MED ORDER — SODIUM CHLORIDE 0.9 % IV SOLN
INTRAVENOUS | Status: DC | PRN
  Administered 2016-12-29: 08:00:00 via INTRAVENOUS

## 2016-12-29 MED ORDER — BUPIVACAINE HCL (PF) 0.5 % IJ SOLN
INTRAMUSCULAR | Status: AC
  Filled 2016-12-29: qty 30

## 2016-12-29 MED ORDER — DIPHENHYDRAMINE HCL 50 MG/ML IJ SOLN: 12.5000 mg | Freq: Four times a day (QID) | INTRAMUSCULAR | Status: DC | PRN

## 2016-12-29 MED ORDER — PANTOPRAZOLE SODIUM 40 MG PO TBEC
40.0000 mg | DELAYED_RELEASE_TABLET | Freq: Every day | ORAL | Status: DC
  Administered 2016-12-30: 40 mg via ORAL
  Filled 2016-12-29: qty 1

## 2016-12-29 MED ORDER — FENTANYL CITRATE (PF) 100 MCG/2ML IJ SOLN
INTRAMUSCULAR | Status: DC | PRN
  Administered 2016-12-29: 150 ug via INTRAVENOUS
  Administered 2016-12-29: 100 ug via INTRAVENOUS

## 2016-12-29 MED ORDER — HEPARIN SODIUM (PORCINE) 5000 UNIT/ML IJ SOLN
5000.0000 [IU] | Freq: Three times a day (TID) | INTRAMUSCULAR | Status: DC
  Administered 2016-12-29 – 2016-12-30 (×3): 5000 [IU] via SUBCUTANEOUS
  Filled 2016-12-29 (×3): qty 1

## 2016-12-29 MED ORDER — PROPOFOL 500 MG/50ML IV EMUL
INTRAVENOUS | Status: DC | PRN
  Administered 2016-12-29: 100 ug/kg/min via INTRAVENOUS

## 2016-12-29 MED ORDER — ONDANSETRON HCL 4 MG/2ML IJ SOLN: 4.0000 mg | INTRAMUSCULAR | Status: DC | PRN

## 2016-12-29 MED ORDER — SODIUM CHLORIDE 0.9 % IV SOLN
INTRAVENOUS | Status: DC
  Administered 2016-12-29 (×2): via INTRAVENOUS

## 2016-12-29 MED ORDER — MIDAZOLAM HCL 2 MG/2ML IJ SOLN
INTRAMUSCULAR | Status: AC
Start: 2016-12-29 — End: 2016-12-29
  Filled 2016-12-29: qty 2

## 2016-12-29 MED ORDER — ACETAMINOPHEN 325 MG PO TABS
650.0000 mg | ORAL_TABLET | ORAL | Status: DC | PRN
  Administered 2016-12-29: 650 mg via ORAL
  Filled 2016-12-29: qty 2

## 2016-12-29 MED ORDER — SIMVASTATIN 40 MG PO TABS
40.0000 mg | ORAL_TABLET | Freq: Every evening | ORAL | Status: DC
  Filled 2016-12-29: qty 1

## 2016-12-29 MED ORDER — INSULIN ASPART 100 UNIT/ML ~~LOC~~ SOLN
0.0000 [IU] | Freq: Three times a day (TID) | SUBCUTANEOUS | Status: DC
  Administered 2016-12-29: 5 [IU] via SUBCUTANEOUS
  Filled 2016-12-29: qty 1

## 2016-12-29 MED ORDER — THROMBIN 5000 UNITS EX SOLR
CUTANEOUS | Status: AC
  Filled 2016-12-29: qty 5000

## 2016-12-29 MED ORDER — FENTANYL CITRATE (PF) 100 MCG/2ML IJ SOLN
INTRAMUSCULAR | Status: AC
  Filled 2016-12-29: qty 2

## 2016-12-29 SURGICAL SUPPLY — 101 items
ANCHOR TIS RET SYS 235ML (MISCELLANEOUS) ×4 IMPLANT
APPLICATOR SURGIFLO ENDO (HEMOSTASIS) ×4 IMPLANT
APPLIER CLIP LOGIC TI 5 (MISCELLANEOUS) IMPLANT
BAG URO DRAIN 2000ML W/SPOUT (MISCELLANEOUS) ×4 IMPLANT
BLADE CLIPPER SURG (BLADE) ×4 IMPLANT
BULB RESERV EVAC DRAIN JP 100C (MISCELLANEOUS) IMPLANT
CANISTER SUCT 1200ML W/VALVE (MISCELLANEOUS) ×4 IMPLANT
CATH DRAINAGE MALECOT 26FR (CATHETERS) ×2 IMPLANT
CATH FOL 2WAY LX 18X5 (CATHETERS) ×8 IMPLANT
CATH MALECOT (CATHETERS) ×4
CHLORAPREP W/TINT 26ML (MISCELLANEOUS) ×8 IMPLANT
CLIP LIGATING HEM O LOK PURPLE (MISCELLANEOUS) ×12 IMPLANT
CLIP SUT LAPRA TY ABSORB (SUTURE) IMPLANT
CORD BIP STRL DISP 12FT (MISCELLANEOUS) ×4 IMPLANT
CORD MONOPOLAR M/FML 12FT (MISCELLANEOUS) ×4 IMPLANT
COVER TIP SHEARS 8 DVNC (MISCELLANEOUS) ×2 IMPLANT
COVER TIP SHEARS 8MM DA VINCI (MISCELLANEOUS) ×2
DEFOGGER SCOPE WARMER CLEARIFY (MISCELLANEOUS) ×4 IMPLANT
DERMABOND ADVANCED (GAUZE/BANDAGES/DRESSINGS) ×2
DERMABOND ADVANCED .7 DNX12 (GAUZE/BANDAGES/DRESSINGS) ×2 IMPLANT
DRAIN CHANNEL JP 15F RND 16 (MISCELLANEOUS) IMPLANT
DRAIN CHANNEL JP 19F (MISCELLANEOUS) IMPLANT
DRAPE LEGGINS SURG 28X43 STRL (DRAPES) ×4 IMPLANT
DRAPE SHEET LG 3/4 BI-LAMINATE (DRAPES) ×8 IMPLANT
DRAPE SURG 17X11 SM STRL (DRAPES) ×16 IMPLANT
DRAPE TABLE BACK 80X90 (DRAPES) ×4 IMPLANT
DRAPE UNDER BUTTOCK W/FLU (DRAPES) ×4 IMPLANT
DRIVER LRG NEEDLE DA VINCI (INSTRUMENTS) ×4
DRIVER NDLE LRG DVNC (INSTRUMENTS) ×4 IMPLANT
DRSG TELFA 3X8 NADH (GAUZE/BANDAGES/DRESSINGS) ×4 IMPLANT
ELECT REM PT RETURN 9FT ADLT (ELECTROSURGICAL) ×4
ELECTRODE REM PT RTRN 9FT ADLT (ELECTROSURGICAL) ×2 IMPLANT
FILTER LAP SMOKE EVAC STRL (MISCELLANEOUS) IMPLANT
FORCEPS MARYLAND BIPOLAR 8X55 (INSTRUMENTS) ×2
FORCEPS MRYLND BPLR 8X55 DVNC (INSTRUMENTS) ×2 IMPLANT
GLOVE BIO SURGEON STRL SZ 6.5 (GLOVE) ×9 IMPLANT
GLOVE BIO SURGEONS STRL SZ 6.5 (GLOVE) ×3
GOWN STRL REUS W/ TWL LRG LVL3 (GOWN DISPOSABLE) ×6 IMPLANT
GOWN STRL REUS W/TWL LRG LVL3 (GOWN DISPOSABLE) ×6
GRASPER SUT TROCAR 14GX15 (MISCELLANEOUS) ×4 IMPLANT
HEMOSTAT SURGICEL 2X14 (HEMOSTASIS) IMPLANT
HOLDER FOLEY CATH W/STRAP (MISCELLANEOUS) ×4 IMPLANT
IRRIGATION STRYKERFLOW (MISCELLANEOUS) ×2 IMPLANT
IRRIGATOR STRYKERFLOW (MISCELLANEOUS) ×4
IV LACTATED RINGERS 1000ML (IV SOLUTION) ×4 IMPLANT
JELLY LUB 2OZ STRL (MISCELLANEOUS) ×2
JELLY LUBE 2OZ STRL (MISCELLANEOUS) ×2 IMPLANT
KIT ACCESSORY DA VINCI DISP (KITS) ×2
KIT ACCESSORY DVNC DISP (KITS) ×2 IMPLANT
KIT PINK PAD W/HEAD ARE REST (MISCELLANEOUS) ×4
KIT PINK PAD W/HEAD ARM REST (MISCELLANEOUS) ×2 IMPLANT
LABEL OR SOLS (LABEL) ×4 IMPLANT
MARKER SKIN DUAL TIP RULER LAB (MISCELLANEOUS) ×4 IMPLANT
NDL SAFETY 18GX1.5 (NEEDLE) ×4 IMPLANT
NEEDLE HYPO 25X1 1.5 SAFETY (NEEDLE) ×4 IMPLANT
NEEDLE INSUFFLATION 14GA 120MM (NEEDLE) ×4 IMPLANT
NS IRRIG 500ML POUR BTL (IV SOLUTION) ×4 IMPLANT
PACK LAP CHOLECYSTECTOMY (MISCELLANEOUS) ×4 IMPLANT
PENCIL ELECTRO HAND CTR (MISCELLANEOUS) ×4 IMPLANT
PROGRASP ENDOWRIST DA VINCI (INSTRUMENTS) ×2
PROGRASP ENDOWRIST DVNC (INSTRUMENTS) ×2 IMPLANT
RELOAD STAPLER WHITE 60MM (STAPLE) ×2 IMPLANT
SCISSORS METZENBAUM CVD 33 (INSTRUMENTS) IMPLANT
SCISSORS MNPLR CVD DVNC (INSTRUMENTS) ×2 IMPLANT
SCISSORS MONOPOLAR CVD (INSTRUMENTS) ×2
SLEEVE ENDOPATH XCEL 5M (ENDOMECHANICALS) ×8 IMPLANT
SOLUTION ELECTROLUBE (MISCELLANEOUS) ×4 IMPLANT
SPOGE SURGIFLO 8M (HEMOSTASIS) ×2
SPONGE LAP 4X18 5PK (MISCELLANEOUS) IMPLANT
SPONGE SURGIFLO 8M (HEMOSTASIS) ×2 IMPLANT
SPONGE VERSALON 4X4 4PLY (MISCELLANEOUS) IMPLANT
STAPLE ECHEON FLEX 60 POW ENDO (STAPLE) ×4 IMPLANT
STAPLER RELOAD WHITE 60MM (STAPLE) ×4
STAPLER SKIN PROX 35W (STAPLE) ×4 IMPLANT
STRAP SAFETY BODY (MISCELLANEOUS) ×4 IMPLANT
SUT DVC VLOC 90 3-0 CV23 UNDY (SUTURE) ×4 IMPLANT
SUT DVC VLOC 90 3-0 CV23 VLT (SUTURE) ×4
SUT ETHILON 3-0 FS-10 30 BLK (SUTURE)
SUT MNCRL 4-0 (SUTURE) ×4
SUT MNCRL 4-0 27XMFL (SUTURE) ×4
SUT PROLENE 5 0 PS 3 (SUTURE) ×4 IMPLANT
SUT SILK 2 0 SH (SUTURE) ×4 IMPLANT
SUT VIC AB 0 CT1 36 (SUTURE) ×8 IMPLANT
SUT VIC AB 2-0 CT1 (SUTURE) ×8 IMPLANT
SUT VIC AB 2-0 SH 27 (SUTURE) ×4
SUT VIC AB 2-0 SH 27XBRD (SUTURE) ×4 IMPLANT
SUT VICRYL 0 AB UR-6 (SUTURE) ×4 IMPLANT
SUTURE DVC VLC 90 3-0 CV23 VLT (SUTURE) ×2 IMPLANT
SUTURE EHLN 3-0 FS-10 30 BLK (SUTURE) IMPLANT
SUTURE MNCRL 4-0 27XMF (SUTURE) ×4 IMPLANT
SYR BULB IRRIG 60ML STRL (SYRINGE) IMPLANT
SYRINGE 10CC LL (SYRINGE) ×4 IMPLANT
SYRINGE IRR TOOMEY STRL 70CC (SYRINGE) ×8 IMPLANT
TAPE CLOTH 10X20 WHT NS LF (TAPE) ×4 IMPLANT
TAPE CLOTH 2X10 WHT NS LF (TAPE) ×4
TROCAR DISP BLADELESS 8 DVNC (TROCAR) ×2 IMPLANT
TROCAR DISP BLADELESS 8MM (TROCAR) ×2
TROCAR ENDOPATH XCEL 12X100 BL (ENDOMECHANICALS) ×8 IMPLANT
TROCAR XCEL 12X100 BLDLESS (ENDOMECHANICALS) ×4 IMPLANT
TROCAR XCEL NON-BLD 5MMX100MML (ENDOMECHANICALS) ×4 IMPLANT
TUBING INSUFFLATOR HI FLOW (MISCELLANEOUS) ×4 IMPLANT

## 2016-12-29 NOTE — H&P (View-Only) (Signed)
12/16/2016 9:54 AM   Keith Holland 12-17-1955 [id number]  Referring provider: Ezequiel Kayser, MD [facility] [place], [place] [address]  CC: [facility] of Prostate Cancer  HPI:  1. Moderate Risk Prostate Cancer - Gleason 3+3=6 in up to 10% of LMM, RMB, RMA by TRUS biopsy 11/2016 on eval PSA 9.52 and Rt induration (T2a).  Pt's father with prostate cancer. DRE 09/2016 30gm with diffuse Rt induration, not fixed. ==> TRUS Biopsy 10/2016 23 mL w/o median lobe.  PMH sig for IDDM2, knee scope, SAH (spontaneous, no deficits). 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 His PCP is Ezequiel Kayser MD with [facility].  Today " Keith Holland " is seen in f/u above. We have previously discussed primary managmetn options for his prostate cancer in detail.    PMH: Past Medical History:  Diagnosis Date  . Diabetes mellitus without complication ([place])   . Hyperlipidemia     Surgical History: Past Surgical History:  Procedure Laterality Date  . MENISCUS REPAIR     x 3    Home Medications:  Allergies as of 12/16/2016   No Known Allergies     Medication List       Accurate as of 12/15/16  9:54 AM. Always use your most recent med list.          accu-chek multiclix lancets Use as instructed two times daily   ciclopirox 8 % solution Commonly known as:  PENLAC Apply topically.   COOL BLOOD GLUCOSE TEST STRIPS VI Use 2 (two) times daily. Use as instructed.   etodolac 500 MG tablet Commonly known as:  LODINE etodolac 500 mg tablet   FARXIGA 10 MG Tabs tablet Generic drug:  dapagliflozin propanediol Take by mouth.   FIFTY50 PEN NEEDLES 31G X 8 MM Misc Generic drug:  Insulin Pen Needle Use as directed. Use one daily.   glipiZIDE 10 MG 24 hr tablet Commonly known as:  GLUCOTROL XL glipizide ER 10 mg tablet, extended release 24 hr   INVOKANA 300 MG Tabs tablet Generic drug:  canagliflozin Invokana 300 mg tablet     metFORMIN 500 MG 24 hr tablet Commonly known as:  GLUCOPHAGE-XR Take by mouth.   naproxen sodium 220 MG tablet Commonly known as:  ANAPROX Take by mouth.   omeprazole 20 MG capsule Commonly known as:  PRILOSEC Take by mouth.   ONGLYZA 5 MG Tabs tablet Generic drug:  saxagliptin HCl Onglyza 5 mg tablet   pioglitazone 30 MG tablet Commonly known as:  ACTOS Take by mouth.   simvastatin 40 MG tablet Commonly known as:  ZOCOR Take by mouth.   TOUJEO SOLOSTAR 300 UNIT/ML Sopn Generic drug:  Insulin Glargine Inject into the skin.   traMADol 50 MG tablet Commonly known as:  ULTRAM tramadol 50 mg tablet  Take 1 tablet every 6 hours by oral route as needed.   VICODIN 5-300 MG Tabs Generic drug:  Hydrocodone-Acetaminophen Vicodin 5 mg-300 mg tablet  Take 1 tablet every 4-6 hours by oral route as needed.       Allergies: No Known Allergies  Family History: Family History  Problem Relation Age of Onset  . Prostate cancer Father   . Stroke Father   . Stroke Mother   . Bladder Cancer Neg Hx   . Kidney cancer Neg Hx     Social History:  reports that he has never smoked. He has quit using smokeless tobacco. His smokeless tobacco use  included Chew. He reports that he drinks alcohol. He reports that he does not use drugs.      Review of Systems  Gastrointestinal (upper)  : Negative for upper GI symptoms  Gastrointestinal (lower) : Negative for lower GI symptoms  Constitutional : Negative for symptoms  Skin: Negative for skin symptoms  Eyes: Negative for eye symptoms  Ear/Nose/Throat : Negative for Ear/Nose/Throat symptoms  Hematologic/Lymphatic: Negative for Hematologic/Lymphatic symptoms  Cardiovascular : Negative for cardiovascular symptoms  Respiratory : Negative for respiratory symptoms  Endocrine: Negative for endocrine symptoms  Musculoskeletal: Negative for musculoskeletal symptoms  Neurological: Negative for neurological  symptoms  Psychologic: Negative for psychiatric symptoms  Physical Exam: There were no vitals taken for this visit.  Constitutional:  Alert and oriented, No acute distress. HEENT: [place] AT, moist mucus membranes.  Trachea midline, no masses. Cardiovascular: No clubbing, cyanosis, or edema. Respiratory: Normal respiratory effort, no increased work of breathing. GI: Abdomen is soft, nontender, nondistended, no abdominal masses GU: No CVA tenderness.  Skin: No rashes, bruises or suspicious lesions. Lymph: No cervical or inguinal adenopathy. Neurologic: Grossly intact, no focal deficits, moving all 4 extremities. Psychiatric: Normal mood and affect.      Assessment & Plan:   1. Moderate Risk Prostate Cancer -  Pt opts for curative intent prostatectomy. I agree given young age and sell controlled comorbidity. Risks, benefits, expected peri-op course discussed. Also frankly discussed expected urinary and sexual function changes that can improve with time but not back to baseline.  PT eval prior to hasten continence recovery.    Alexis Frock, [facility] [address], [place], [place] [address] [phone]

## 2016-12-29 NOTE — Anesthesia Procedure Notes (Signed)
Procedure Name: Intubation Date/Time: 12/29/2016 7:59 AM Performed by: Marsh Dolly Pre-anesthesia Checklist: Patient identified, Patient being monitored, Timeout performed, Emergency Drugs available and Suction available Patient Re-evaluated:Patient Re-evaluated prior to inductionOxygen Delivery Method: Circle system utilized Preoxygenation: Pre-oxygenation with 100% oxygen Intubation Type: IV induction Ventilation: Mask ventilation without difficulty Laryngoscope Size: 3 and Miller Grade View: Grade I Tube type: Oral Tube size: 7.5 mm Number of attempts: 1 Placement Confirmation: ETT inserted through vocal cords under direct vision,  positive ETCO2 and breath sounds checked- equal and bilateral Secured at: 21 cm Tube secured with: Tape Dental Injury: Teeth and Oropharynx as per pre-operative assessment

## 2016-12-29 NOTE — Anesthesia Postprocedure Evaluation (Signed)
Anesthesia Post Note  Patient: Izak Anding Mandelbaum  Procedure(s) Performed: Procedure(s) (LRB): ROBOTIC ASSISTED LAPAROSCOPIC RADICAL PROSTATECTOMY (N/A)  Patient location during evaluation: PACU Anesthesia Type: General Level of consciousness: awake and alert and oriented Pain management: pain level controlled Vital Signs Assessment: post-procedure vital signs reviewed and stable Respiratory status: spontaneous breathing, nonlabored ventilation and respiratory function stable Cardiovascular status: blood pressure returned to baseline and stable Postop Assessment: no signs of nausea or vomiting Anesthetic complications: no     Last Vitals:  Vitals:   12/29/16 1145 12/29/16 1202  BP: 137/83 132/87  Pulse: 73 80  Resp: 13 13  Temp:      Last Pain:  Vitals:   12/29/16 1145  TempSrc:   PainSc: 3                  Mikele Sifuentes

## 2016-12-29 NOTE — Progress Notes (Signed)
Called Dr. Erlene Quan regarding pain medication per patient request.  Appropriate orders were placed.  Keith Holland  12/29/2016 11:29 PM

## 2016-12-29 NOTE — Anesthesia Post-op Follow-up Note (Cosign Needed)
Anesthesia QCDR form completed.        

## 2016-12-29 NOTE — Interval H&P Note (Signed)
History and Physical Interval Note:  12/29/2016 7:22 AM  Keith Holland  has presented today for surgery, with the diagnosis of PROSTATE CANCER  The various methods of treatment have been discussed with the patient and family. After consideration of risks, benefits and other options for treatment, the patient has consented to  Procedure(s): ROBOTIC ASSISTED LAPAROSCOPIC RADICAL PROSTATECTOMY (N/A) PELVIC LYMPH NODE DISSECTION (N/A) as a surgical intervention .  The patient's history has been reviewed, patient examined, no change in status, stable for surgery.  I have reviewed the patient's chart and labs.  Questions were answered to the patient's satisfaction.    RRR CTAB  Hollice Espy

## 2016-12-29 NOTE — Transfer of Care (Signed)
Immediate Anesthesia Transfer of Care Note  Patient: Keith Holland  Procedure(s) Performed: Procedure(s): ROBOTIC ASSISTED LAPAROSCOPIC RADICAL PROSTATECTOMY (N/A)  Patient Location: PACU  Anesthesia Type:General  Level of Consciousness: awake, alert  and oriented  Airway & Oxygen Therapy: Patient Spontanous Breathing and Patient connected to face mask oxygen  Post-op Assessment: Report given to RN  Post vital signs: Reviewed and stable  Last Vitals:  Vitals:   12/29/16 0617 12/29/16 1106  BP: 123/84   Pulse: 65   Resp:    Temp: 36.6 C 36.7 C    Last Pain:  Vitals:   12/29/16 0613  TempSrc: Tympanic  PainSc: 4          Complications: No apparent anesthesia complications

## 2016-12-29 NOTE — Brief Op Note (Signed)
12/29/2016  11:10 AM  PATIENT:  Keith Holland  61 y.o. male  PRE-OPERATIVE DIAGNOSIS:  PROSTATE CANCER  POST-OPERATIVE DIAGNOSIS:  PROSTATE CANCER  PROCEDURE:  Procedure(s): ROBOTIC ASSISTED LAPAROSCOPIC RADICAL PROSTATECTOMY (N/A)  SURGEON:  Surgeon(s) and Role:    * Hollice Espy, MD - Primary  ASSISTANTS: Link Snuffer, PA   ANESTHESIA:   local and general  EBL:  Total I/O In: 1200 [I.V.:1200] Out: 150 [Blood:150]  Drains: 18 Fr Foley  Specimen: prostate with seminal vesicles/ vas  COUNTS CORRECT: YES  PLAN OF CARE: Admit for overnight observation  PATIENT DISPOSITION:  PACU - hemodynamically stable.

## 2016-12-29 NOTE — Op Note (Signed)
12/29/16  PREOPERATIVE DIAGNOSIS: Prostate cancer.  POSTOPERATIVE DIAGNOSIS: Prostate cancer.  OPERATION PERFORMED: 1. DaVinci laparoscopic radical prostatectomy (bilateral partial nerve sparing)  SURGEON: Hollice Espy, MD  ASSISTANTS: Link Snuffer, PA  ANESTHESIA: General.  EBL: 150 cc  SPECIMEN: Prostate with bilateral seminal vesicals, anterior fat pad.  FINDINGS: Accessory Pudendal Vessel: none  INDICATION: Pt.is a 61 year old male with Gleason 3+3 prostate cancer. Treatment options were discussed with him at length and he chose DaVinci radical prostatectomy. He has fairly poor baseline erections, no morning pontaneous erections but occasionally adequate for penetration without medications.  As such, a nerve sparing approach was attempted. Repeat rectal exam prior to the procedure showed some prostatic asymmetry but no discrete nodules. As such, the risk stratification did not warrant pelvic lymph node dissection (1% risk of LN involvement).   PROCEDURE IN DETAIL: Patient was given Ancef preoperatively. He had sequential compression devices applied preoperatively for DVT prophylaxis. He was taken to the operating room where he was induced with general anesthesia. After adequate anesthesia, he was placed in the dorsal lithotomy position. His arms were draped by his side and was appropriately padded and secured to the operating room table. He was placed in the Trendelenburg position.  He was prepped and draped in sterile fashion. An 73 French Foley was placed in the bladder and instilled with 15 cc sterile water. Orogastric tube was placed. The Veress needle was passed just above the umbilicus and the abdomen was insufflated to 15 atmospheres. A 12 mm, blunt-tip trocar was placed just above the umbilicus. The zero-degree camera was passed within this and the following trocars were placed under direct vision; 8 mm robotic trocars were placed 9 cm laterally and  inferiorly to the initially placed umbilical trocar. A third one was placed 7 cm lateral to the left-sided trocar. In the corresponding position on the right side, a 12 mm trocar was placed, and then a 5 mm trocar was placed to the right and well above the umbilicus.  The robot was then docked with the robot trocar. I used the zero-degree camera. I had the hot scissors in the right hand and the left hand with the Wisconsin bipolar and far left hand the Prograsp forceps. Initially I divided the median umbilical ligament bilaterally and the urachus and developed the space of Retzius down to pubic bone. I divided the peritoneum laterally up to the vas deferens on each side. I used the Cardier forceps to provide cranial traction on the urachus. I cleaned off the Endopelvic fascia on each side and then divided it with the scissors laterally to the perirectal fat and medially to the puboprostatic ligaments which were divided. I then ligated the dorsal vein complex using a 30 mm vascular load stapler.   I then addressed the bladder neck with a 30-degree down lens. I identified the bladder neck by pulling on the Foley catheter. I divided the anterior bladder neck musculature until I then found the anterior bladder neck mucosa which was incised. I identified the Foley catheter within, deflated the balloon, pulled the Foley out through this opening and then using the Carter-Thomason needle with a #0-Vicryl suture, passed through The suprapubic region and pulled the suture through the eye of the Foley and then back out. This allowed me to provide upward traction on the prostate. I then divided the lateral bladder neck mucosa and the posterior bladder neck mucosa. I was well away from ureteral orifices. I divided the posterior bladder neck musculature until I identified  the vas deferens. They were freed proximally, then divided. I freed up the seminal vesicals using blunt and sharp dissection.  Extremely judicious use of electrocautery was used near the seminal vesicle tips to avoid injury to neurovascular bundle.   I then went back to the 0-degree lens. I divided the Denonvilliers fascia beneath the prostate and developed the prostate off the rectum. I then did a bilateral partial nerve sparing by  creating a plane which was intrafascial. I then isolated the pedicles of the prostate and placed weck clips on the pedicles of the prostate and then divided it with cold scissors. I continued to divide the neurovascular bundles off the prostate out to the apex of the prostate. At this point the prostate was freed up except for the urethra. I addressed the prostate anteriorly, divided the dorsal vein , then the anterior urethral wall, pulled the Foley catheter back and then divided the posterior urethral wall. Specimen was completely freed up. I placed the prostate in an Endo catch bag and then placed the bag in the upper abdomen out of the way. I then irrigated the pelvis. The rectal test was negative. There was reasonable hemostasis.  With good hemostasis, I then did the posterior reconstruction. I used a 3-0 VLoc suture on an RB1 through the cut edge of Denonvilliers fascia beneath the bladder on the right side and through the posterior striated sphincter underneath the urethra. This brought the bladder neck and urethra and closer proximity to help facilitate anastomosis.   I then did the urethral vesicle anastomosis again with two 3-0 VLoc sutures on an RB1 needle interlocked. I passed both ends of the suture from the outside-in through the bladder neck at the 6 o'clock position. I passed both through the urethral stump from the inside-out in the corresponding position. I reapproximated the bladder neck to the urethra. I then ran the Left suture on the left side anastomosis to the 9 o'clock position. Then I went back to the right sided suture and ran that up the right side to  the 12 o'clock position. I then continued the left suture to the 12 o'clock position.The suture was then suspended anteriorly behind the pubic bone.   I then placed a new 38 French Foley into the bladder and filled it with 10 cc sterile water. I irrigated the bladder with 160 cc. There was no leakage. There was reasonable hemostasis.  Surgiflo was used on either side of the pedicles for an additional hemostasis.  The instruments were then removed. The robot was undocked and all the trocars were removed under direct vision. There was good hemostasis. I then enlarged the umbilical trocar site large enough to remove the prostate and I closed the fascia here with #0-0 Vicryl suture in a running fashion. All the port sites were irrigated. Lidocaine was injected into all the trocar sites. The skin was closed with 4-0 Monocryl in running subcuticular fashion. Dermabond was applied.  At this point patient was awakened and extubated in the operating room and taken to the recovery room in stable condition. There were no complications. All counts correct.  Hollice Espy, MD

## 2016-12-29 NOTE — Anesthesia Preprocedure Evaluation (Signed)
Anesthesia Evaluation  Patient identified by MRN, date of birth, ID band Patient awake    Reviewed: Allergy & Precautions, NPO status , Patient's Chart, lab work & pertinent test results  History of Anesthesia Complications (+) PONV and history of anesthetic complications  Airway Mallampati: III  TM Distance: >3 FB Neck ROM: Full    Dental no notable dental hx.    Pulmonary neg pulmonary ROS, neg sleep apnea, neg COPD,    breath sounds clear to auscultation- rhonchi (-) wheezing      Cardiovascular Exercise Tolerance: Good (-) hypertension(-) CAD and (-) Past MI  Rhythm:Regular Rate:Normal - Systolic murmurs and - Diastolic murmurs    Neuro/Psych negative neurological ROS  negative psych ROS   GI/Hepatic Neg liver ROS, GERD  ,  Endo/Other  diabetes, Insulin Dependent, Oral Hypoglycemic Agents  Renal/GU negative Renal ROS     Musculoskeletal  (+) Arthritis ,   Abdominal (+) + obese,   Peds  Hematology negative hematology ROS (+)   Anesthesia Other Findings Past Medical History: No date: Arthritis No date: Complication of anesthesia     Comment: severe n/v No date: Diabetes mellitus without complication (HCC) No date: GERD (gastroesophageal reflux disease) No date: Hyperlipidemia No date: PONV (postoperative nausea and vomiting) No date: SAH (subarachnoid hemorrhage) (HCC)     Comment: spontaneous 2000 n surgery No date: Vertigo     Comment: since childhood if on back long will get               vertigo    Reproductive/Obstetrics                             Anesthesia Physical Anesthesia Plan  ASA: II  Anesthesia Plan: General   Post-op Pain Management:    Induction: Intravenous  PONV Risk Score and Plan: 2 and Ondansetron, Dexamethasone and Propofol  Airway Management Planned: Oral ETT  Additional Equipment:   Intra-op Plan:   Post-operative Plan: Extubation in  OR  Informed Consent: I have reviewed the patients History and Physical, chart, labs and discussed the procedure including the risks, benefits and alternatives for the proposed anesthesia with the patient or authorized representative who has indicated his/her understanding and acceptance.   Dental advisory given  Plan Discussed with: CRNA and Anesthesiologist  Anesthesia Plan Comments: (Plan for TIVA given hx of severe PONV)        Anesthesia Quick Evaluation

## 2016-12-30 ENCOUNTER — Telehealth: Payer: Self-pay | Admitting: Urology

## 2016-12-30 ENCOUNTER — Encounter: Payer: Self-pay | Admitting: Urology

## 2016-12-30 DIAGNOSIS — C61 Malignant neoplasm of prostate: Secondary | ICD-10-CM | POA: Diagnosis not present

## 2016-12-30 LAB — CBC
HCT: 38.8 % — ABNORMAL LOW (ref 40.0–52.0)
Hemoglobin: 13.3 g/dL (ref 13.0–18.0)
MCH: 31.7 pg (ref 26.0–34.0)
MCHC: 34.3 g/dL (ref 32.0–36.0)
MCV: 92.4 fL (ref 80.0–100.0)
PLATELETS: 184 10*3/uL (ref 150–440)
RBC: 4.2 MIL/uL — AB (ref 4.40–5.90)
RDW: 13.6 % (ref 11.5–14.5)
WBC: 9.8 10*3/uL (ref 3.8–10.6)

## 2016-12-30 LAB — GLUCOSE, CAPILLARY
GLUCOSE-CAPILLARY: 111 mg/dL — AB (ref 65–99)
Glucose-Capillary: 112 mg/dL — ABNORMAL HIGH (ref 65–99)

## 2016-12-30 LAB — BASIC METABOLIC PANEL
ANION GAP: 6 (ref 5–15)
BUN: 14 mg/dL (ref 6–20)
CALCIUM: 8.3 mg/dL — AB (ref 8.9–10.3)
CO2: 24 mmol/L (ref 22–32)
CREATININE: 0.72 mg/dL (ref 0.61–1.24)
Chloride: 111 mmol/L (ref 101–111)
Glucose, Bld: 120 mg/dL — ABNORMAL HIGH (ref 65–99)
Potassium: 4 mmol/L (ref 3.5–5.1)
Sodium: 141 mmol/L (ref 135–145)

## 2016-12-30 MED ORDER — TRAMADOL HCL 50 MG PO TABS: 50.0000 mg | ORAL_TABLET | Freq: Four times a day (QID) | ORAL | 0 refills | Status: DC | PRN

## 2016-12-30 MED ORDER — DOCUSATE SODIUM 100 MG PO CAPS: 100.0000 mg | ORAL_CAPSULE | Freq: Two times a day (BID) | ORAL | 0 refills | Status: DC

## 2016-12-30 MED ORDER — OXYBUTYNIN CHLORIDE 5 MG PO TABS: 5.0000 mg | ORAL_TABLET | Freq: Three times a day (TID) | ORAL | 0 refills | Status: DC | PRN

## 2016-12-30 NOTE — Telephone Encounter (Signed)
The hospital called to schedule his follow up and I made it with the nurse for a void and trial is this ok or does it have to be on your schedule?  Please advise  Sharyn Lull

## 2016-12-30 NOTE — Discharge Summary (Signed)
Date of admission: 12/29/2016  Date of discharge: 12/30/2016  Admission diagnosis: prostate cancer  Discharge diagnosis: prostate cancer  Secondary diagnoses:  Patient Active Problem List   Diagnosis Date Noted  . Prostate cancer ([facility]) 11/30/2016  . Elevated PSA 10/13/2016    History and Physical: For full details, please see admission history and physical. Briefly, Keith Holland is a 61 y.o. year old patient with prostate cancer s/p uncomplicated robotic radical prostatectomy.   Hospital Course: Patient tolerated the procedure well.  He was then transferred to the floor after an uneventful PACU stay.  His hospital course was uncomplicated.  On POD#1 he had met discharge criteria: was eating a regular diet, was up and ambulating independently,  pain was well controlled, received Foley teaching, and was ready to for discharge.  Physical Exam  Constitutional: He is oriented to person, place, and time. He appears well-developed and well-nourished.  HENT:  Head: Normocephalic and atraumatic.  Eyes: Conjunctivae and EOM are normal.  Neck: Normal range of motion. Neck supple.  Cardiovascular: Normal rate.   Pulmonary/Chest: Effort normal. No respiratory distress.  Abdominal: Soft. He exhibits no distension. There is no tenderness.  Wound c/d/i  Genitourinary: Penis normal.  Genitourinary Comments: Foley in place draining clear yellow urine  Neurological: He is alert and oriented to person, place, and time.  Skin: Skin is warm and dry.  Psychiatric: He has a normal mood and affect. His behavior is normal.  Vitals reviewed.  Blood pressure 124/73, pulse 72, temperature 98.2 F (36.8 C), temperature source Oral, resp. rate 18, height '5\' 6"'  (1.676 m), weight 193 lb (87.5 kg), SpO2 94 %.   Laboratory values:   Recent Labs  12/29/16 1413 12/30/16 0503  WBC 9.8 9.8  HGB 14.6 13.3  HCT 43.5 38.8*    Recent Labs  12/30/16 0503  NA 141  K 4.0  CL 111  CO2 24  GLUCOSE 120*   BUN 14  CREATININE 0.72  CALCIUM 8.3*   No results for input(s): LABPT, INR in the last 72 hours. No results for input(s): LABURIN in the last 72 hours. Results for orders placed or performed during the hospital encounter of 12/18/16  Urine culture     Status: None   Collection Time: 12/18/16  1:42 PM  Result Value Ref Range Status   Specimen Description URINE, CLEAN CATCH  Final   Special Requests NONE  Final   Culture   Final    NO GROWTH Performed at [facility], [place] [address] [place], [place] [address]    Report Status 12/19/2016 FINAL  Final    Disposition: Home   Discharge instruction: Activity:  You are encouraged to ambulate frequently (about every hour during waking hours) to help prevent blood clots from forming in your legs or lungs.  However, you should not engage in any heavy lifting (> 5-10 lbs), strenuous activity, or straining.   Diet: You should advance your diet as instructed by your physician.  It will be normal to have some bloating, nausea, and abdominal discomfort intermittently.   Prescriptions:  You will be provided a prescription for pain medication to take as needed.  If your pain is not severe enough to require the prescription pain medication, you may take extra strength Tylenol instead which will have less side effects.  You should also take a prescribed stool softener to avoid straining with bowel movements as the prescription pain medication may constipate you.   Incisions: You may remove your  dressing bandages 48 hours after surgery if not removed in the hospital.  You will either have some small staples or special tissue glue at each of the incision sites. Once the bandages are removed (if present), the incisions may stay open to air.  You may start showering (but not soaking or bathing in water) the 2nd day after surgery and the incisions simply need to be patted dry after the shower.  No additional care is needed.  What to call us about:  You should call the office if you develop fever > 101 or develop persistent vomiting, redness or draining around your incision, or any other concerning symptoms.    [facility] [address] [place], [place] [address] [phone]  Routine Foley care  Discharge medications:  Allergies as of 12/30/2016   No Known Allergies     Medication List    TAKE these medications   docusate sodium 100 MG capsule Commonly known as:  COLACE Take 1 capsule (100 mg total) by mouth 2 (two) times daily.   FARXIGA 10 MG Tabs tablet Generic drug:  dapagliflozin propanediol Take 10 mg by mouth every morning.   metFORMIN 500 MG 24 hr tablet Commonly known as:  GLUCOPHAGE-XR Take 1,000 mg by mouth every evening.   naproxen sodium 220 MG tablet Commonly known as:  ANAPROX Take 220-440 mg by mouth See admin instructions. 2 tablets every morning and 1 tablet every evening   omeprazole 20 MG capsule Commonly known as:  PRILOSEC Take 20 mg by mouth daily.   oxybutynin 5 MG tablet Commonly known as:  DITROPAN Take 1 tablet (5 mg total) by mouth every 8 (eight) hours as needed for bladder spasms.   pioglitazone 30 MG tablet Commonly known as:  ACTOS Take 30 mg by mouth every evening.   simvastatin 40 MG tablet Commonly known as:  ZOCOR Take 40 mg by mouth every evening.   traMADol 50 MG tablet Commonly known as:  ULTRAM Take 1 tablet (50 mg total) by mouth every 6 (six) hours as needed.   TRESIBA FLEXTOUCH 100 UNIT/ML Sopn FlexTouch Pen Generic drug:  insulin degludec Inject 10 Units into the skin every evening.       Followup:  Follow-up Information    Hollice Espy, MD In 1 week.   Specialty:  Urology Why:  voiding trial Contact information: [address] [place] [address] [phone]

## 2016-12-30 NOTE — Progress Notes (Signed)
Patient discharge teaching given, including activity, diet, follow-up appoints, and medications. Patient verbalized understanding of all discharge instructions. Pt was taught how to switch standard drainage bag to leg bag and how to properly demontrate foley care. Pt demonstrated understanding with teach back. IV access was d/c'd. Vitals are stable. Skin is intact except as charted in most recent assessments. Pt refused to be escorted out, to be driven home by family.  Mansur Patti [employer]

## 2016-12-30 NOTE — Telephone Encounter (Signed)
Thank you :)

## 2016-12-30 NOTE — Discharge Instructions (Signed)
· Activity:  You are encouraged to ambulate frequently (about every hour during waking hours) to help prevent blood clots from forming in your legs or lungs.  However, you should not engage in any heavy lifting (> 5-10 lbs), strenuous activity, or straining. ° °· Diet: You should advance your diet as instructed by your physician.  It will be normal to have some bloating, nausea, and abdominal discomfort intermittently. ° °· Prescriptions:  You will be provided a prescription for pain medication to take as needed.  If your pain is not severe enough to require the prescription pain medication, you may take extra strength Tylenol instead which will have less side effects.  You should also take a prescribed stool softener to avoid straining with bowel movements as the prescription pain medication may constipate you. ° °· Incisions: You may remove your dressing bandages 48 hours after surgery if not removed in the hospital.  You will either have some small staples or special tissue glue at each of the incision sites. Once the bandages are removed (if present), the incisions may stay open to air.  You may start showering (but not soaking or bathing in water) the 2nd day after surgery and the incisions simply need to be patted dry after the shower.  No additional care is needed. ° °What to call us about: You should call the office if you develop fever > 101 or develop persistent vomiting, redness or draining around your incision, or any other concerning symptoms.   ° °[facility] °[address], [address] °[place], [place] [address] °[phone] ° ° °Indwelling Urinary Catheter Care, Adult °Take good care of your catheter to keep it working and to prevent problems. °How to wear your catheter °Attach your catheter to your leg with tape (adhesive tape) or a leg strap. Make sure it is not too tight. If you use tape, remove any bits of tape that are already on the catheter. °How to wear a drainage  bag °You should have: °· A large overnight bag. °· A small leg bag. ° °Overnight Bag °You may wear the overnight bag at any time. Always keep the bag below the level of your bladder but off the floor. When you sleep, put a clean plastic bag in a wastebasket. Then hang the bag inside the wastebasket. °Leg Bag °Never wear the leg bag at night. Always wear the leg bag below your knee. Keep the leg bag secure with a leg strap or tape. °How to care for your skin °· Clean the skin around the catheter at least once every day. °· Shower every day. Do not take baths. °· Put creams, lotions, or ointments on your genital area only as told by your doctor. °· Do not use powders, sprays, or lotions on your genital area. °How to clean your catheter and your skin °1. Wash your hands with soap and water. °2. Wet a washcloth in warm water and gentle (mild) soap. °3. Use the washcloth to clean the skin where the catheter enters your body. Clean downward and wipe away from the catheter in small circles. Do not wipe toward the catheter. °4. Pat the area dry with a clean towel. Make sure to clean off all soap. °How to care for your drainage bags °Empty your drainage bag when it is ?-½ full or at least 2-3 times a day. Replace your drainage bag once a month or sooner if it starts to smell bad or look dirty. Do not clean your   drainage bag unless told by your doctor. °Emptying a drainage bag ° °Supplies Needed °· Rubbing alcohol. °· Gauze pad or cotton ball. °· Tape or a leg strap. ° °Steps °1. Wash your hands with soap and water. °2. Separate (detach) the bag from your leg. °3. Hold the bag over the toilet or a clean container. Keep the bag below your hips and bladder. This stops pee (urine) from going back into the tube. °4. Open the pour spout at the bottom of the bag. °5. Empty the pee into the toilet or container. Do not let the pour spout touch any surface. °6. Put rubbing alcohol on a gauze pad or cotton ball. °7. Use the gauze pad  or cotton ball to clean the pour spout. °8. Close the pour spout. °9. Attach the bag to your leg with tape or a leg strap. °10. Wash your hands. ° °Changing a drainage bag °Supplies Needed °· Alcohol wipes. °· A clean drainage bag. °· Adhesive tape or a leg strap. ° °Steps °1. Wash your hands with soap and water. °2. Separate the dirty bag from your leg. °3. Pinch the rubber catheter with your fingers so that pee does not spill out. °4. Separate the catheter tube from the drainage tube where these tubes connect (at the connection valve). Do not let the tubes touch any surface. °5. Clean the end of the catheter tube with an alcohol wipe. Use a different alcohol wipe to clean the end of the drainage tube. °6. Connect the catheter tube to the drainage tube of the clean bag. °7. Attach the new bag to the leg with adhesive tape or a leg strap. °8. Wash your hands. ° °How to prevent infection and other problems °· Never pull on your catheter or try to remove it. Pulling can damage tissue in your body. °· Always wash your hands before and after touching your catheter. °· If a leg strap gets wet, replace it with a dry one. °· Drink enough fluids to keep your pee clear or pale yellow, or as told by your doctor. °· Do not let the drainage bag or tubing touch the floor. °· Wear cotton underwear. °· If you are male, wipe from front to back after you poop (have a bowel movement). °· Check on the catheter often to make sure it works and the tubing is not twisted. °Get help if: °· Your pee is cloudy. °· Your pee smells unusually bad. °· Your pee is not draining into the bag. °· Your tube gets clogged. °· Your catheter starts to leak. °· Your bladder feels full. °Get help right away if: °· You have redness, swelling, or pain where the catheter enters your body. °· You have fluid, pus, or a bad smell coming from the area where the catheter enters your body. °· The area where the catheter enters your body feels warm. °· You have a  fever. °· You have pain in your: °? Stomach (abdomen). °? Legs. °? Lower back. °? Bladder. °· You see blood fill the catheter. °· Your pee is pink or red. °· You feel sick to your stomach (nauseous). °· You throw up (vomit). °· You have chills. °· Your catheter gets pulled out. °This information is not intended to replace advice given to you by your health care provider. Make sure you discuss any questions you have with your health care provider. °Document Released: 10/25/2012 Document Revised: 05/28/2016 Document Reviewed: 12/13/2013 °Elsevier Interactive Patient Education © 2018 [employer] ° °

## 2016-12-30 NOTE — Telephone Encounter (Signed)
Nurse schedule is okay. Put him on my schedule for 4 weeks with a PSA the day prior.  Hollice Espy, MD

## 2016-12-31 ENCOUNTER — Ambulatory Visit: Payer: BC Managed Care – PPO

## 2017-01-02 ENCOUNTER — Other Ambulatory Visit: Payer: Self-pay | Admitting: Anatomic Pathology & Clinical Pathology

## 2017-01-02 LAB — SURGICAL PATHOLOGY

## 2017-01-06 ENCOUNTER — Ambulatory Visit: Payer: BC Managed Care – PPO

## 2017-01-06 VITALS — BP 123/75 | HR 78 | Ht 66.0 in | Wt 186.6 lb

## 2017-01-06 DIAGNOSIS — C61 Malignant neoplasm of prostate: Secondary | ICD-10-CM

## 2017-01-06 NOTE — Progress Notes (Signed)
Catheter Removal  Patient is present today for a catheter removal.  35ml of water was drained from the balloon. A 16FR foley cath was removed from the bladder no complications were noted . Patient tolerated well.  Performed by: C. Corinna Capra, CMA  Follow up/ Additional notes: Advised pt to return if unable to void today, and encouraged to keep upcoming follow up appt.

## 2017-01-12 ENCOUNTER — Emergency Department
Admission: EM | Admit: 2017-01-12 | Discharge: 2017-01-13 | Disposition: A | Discharge status: Home / Self Care | Payer: BC Managed Care – PPO | Attending: Emergency Medicine | Admitting: Emergency Medicine

## 2017-01-12 ENCOUNTER — Encounter: Payer: Self-pay | Admitting: Emergency Medicine

## 2017-01-12 DIAGNOSIS — Z87891 Personal history of nicotine dependence: Secondary | ICD-10-CM | POA: Insufficient documentation

## 2017-01-12 DIAGNOSIS — Z7984 Long term (current) use of oral hypoglycemic drugs: Secondary | ICD-10-CM | POA: Insufficient documentation

## 2017-01-12 DIAGNOSIS — E119 Type 2 diabetes mellitus without complications: Secondary | ICD-10-CM | POA: Diagnosis not present

## 2017-01-12 DIAGNOSIS — N390 Urinary tract infection, site not specified: Secondary | ICD-10-CM | POA: Insufficient documentation

## 2017-01-12 DIAGNOSIS — Z794 Long term (current) use of insulin: Secondary | ICD-10-CM | POA: Insufficient documentation

## 2017-01-12 DIAGNOSIS — Z8546 Personal history of malignant neoplasm of prostate: Secondary | ICD-10-CM | POA: Diagnosis not present

## 2017-01-12 DIAGNOSIS — Z79899 Other long term (current) drug therapy: Secondary | ICD-10-CM | POA: Diagnosis not present

## 2017-01-12 DIAGNOSIS — R300 Dysuria: Secondary | ICD-10-CM | POA: Diagnosis not present

## 2017-01-12 LAB — COMPREHENSIVE METABOLIC PANEL
ALBUMIN: 4.1 g/dL (ref 3.5–5.0)
ALK PHOS: 62 U/L (ref 38–126)
ALT: 20 U/L (ref 17–63)
ANION GAP: 7 (ref 5–15)
AST: 19 U/L (ref 15–41)
BILIRUBIN TOTAL: 0.7 mg/dL (ref 0.3–1.2)
BUN: 21 mg/dL — AB (ref 6–20)
CALCIUM: 9.4 mg/dL (ref 8.9–10.3)
CO2: 27 mmol/L (ref 22–32)
CREATININE: 1 mg/dL (ref 0.61–1.24)
Chloride: 106 mmol/L (ref 101–111)
GFR calc Af Amer: >60 mL/min (ref >60)
GFR calc non Af Amer: >60 mL/min (ref >60)
GLUCOSE: 221 mg/dL — AB (ref 65–99)
Potassium: 4.6 mmol/L (ref 3.5–5.1)
Sodium: 140 mmol/L (ref 135–145)
TOTAL PROTEIN: 7.2 g/dL (ref 6.5–8.1)

## 2017-01-12 LAB — CBC
HCT: 41 % (ref 40.0–52.0)
HEMOGLOBIN: 13.4 g/dL (ref 13.0–18.0)
MCH: 29.8 pg (ref 26.0–34.0)
MCHC: 32.8 g/dL (ref 32.0–36.0)
MCV: 91 fL (ref 80.0–100.0)
PLATELETS: 347 10*3/uL (ref 150–440)
RBC: 4.5 MIL/uL (ref 4.40–5.90)
RDW: 13.7 % (ref 11.5–14.5)
WBC: 9.9 10*3/uL (ref 3.8–10.6)

## 2017-01-12 LAB — URINALYSIS, COMPLETE (UACMP) WITH MICROSCOPIC
Bilirubin Urine: NEGATIVE
Ketones, ur: NEGATIVE mg/dL
Nitrite: NEGATIVE
PH: 5 (ref 5.0–8.0)
PROTEIN: 30 mg/dL — AB
SPECIFIC GRAVITY, URINE: 1.02 (ref 1.005–1.030)
Squamous Epithelial / LPF: NONE SEEN

## 2017-01-12 NOTE — ED Triage Notes (Signed)
Pt arrived to the ED accompanied by his wife for complaints of "bladder pain and bloody urine" secondary to a surgery that the Pt had done 2 weeks ago. Pt reports that he had his prostate removed because of cancer and had a catheter in place until about 1 week ago. Pt is AOx4 in moderate pain distress. Dr. Erlene Quan performed the Pt's surgery.

## 2017-01-13 ENCOUNTER — Telehealth: Payer: Self-pay

## 2017-01-13 MED ORDER — CEFPODOXIME PROXETIL 100 MG PO TABS: 100.0000 mg | ORAL_TABLET | Freq: Two times a day (BID) | ORAL | 0 refills | Status: DC

## 2017-01-13 MED ORDER — OXYCODONE-ACETAMINOPHEN 5-325 MG PO TABS: 2.0000 | ORAL_TABLET | Freq: Once | ORAL | Status: DC

## 2017-01-13 MED ORDER — OXYCODONE HCL 5 MG PO TABS: 5.0000 mg | ORAL_TABLET | Freq: Once | ORAL | Status: DC

## 2017-01-13 MED ORDER — ONDANSETRON HCL 4 MG/2ML IJ SOLN
4.0000 mg | Freq: Once | INTRAMUSCULAR | Status: AC
  Administered 2017-01-13: 4 mg via INTRAVENOUS

## 2017-01-13 MED ORDER — CEFTRIAXONE SODIUM 1 G IJ SOLR
1.0000 g | Freq: Once | INTRAMUSCULAR | Status: AC
  Administered 2017-01-13: 1 g via INTRAVENOUS
  Filled 2017-01-13: qty 10

## 2017-01-13 MED ORDER — SODIUM CHLORIDE 0.9 % IV BOLUS (SEPSIS)
1000.0000 mL | Freq: Once | INTRAVENOUS | Status: AC
  Administered 2017-01-13: 1000 mL via INTRAVENOUS

## 2017-01-13 MED ORDER — ONDANSETRON 4 MG PO TBDP: 4.0000 mg | ORAL_TABLET | Freq: Three times a day (TID) | ORAL | 0 refills | Status: DC | PRN

## 2017-01-13 MED ORDER — ONDANSETRON HCL 4 MG/2ML IJ SOLN
INTRAMUSCULAR | Status: AC
  Filled 2017-01-13: qty 2

## 2017-01-13 MED ORDER — OXYCODONE-ACETAMINOPHEN 5-325 MG PO TABS
ORAL_TABLET | ORAL | Status: AC
  Administered 2017-01-13: 1
  Filled 2017-01-13: qty 1

## 2017-01-13 NOTE — ED Provider Notes (Signed)
[facility] Emergency Department Provider Note  ____________________________________________  Time seen: Approximately 1:49 AM  I have reviewed the triage vital signs and the nursing notes.   HISTORY  Chief Complaint Dysuria and Post-op Problem    HPI Keith Holland is a 61 y.o. male complains of painful bloody urine today. He had robotic surgery to resect the prostate 2 weeks ago by Dr. Erlene Quan. He had a Foley catheter and afterward for 1 week. This was removed 1 week ago. Pain had steadily decreased after surgery and his symptoms have been very well-controlled. Did not have any hematuria until today when he started having one episode of hematuria and worsening pain were particularly worse with urination. No blood clots. He does report bilateral flank pain and subjective fever which she did not check at home. No alleviating factors. Nonradiating. Severe in intensity. Started taking his pain medicine that he had for after surgery again.     Past Medical History:  Diagnosis Date  . Arthritis   . Complication of anesthesia    severe n/v  . Diabetes mellitus without complication ([place])   . GERD (gastroesophageal reflux disease)   . Hyperlipidemia   . PONV (postoperative nausea and vomiting)   . SAH (subarachnoid hemorrhage) ([place])    spontaneous 2000 n surgery  . Vertigo    since childhood if on back long will get vertigo      Patient Active Problem List   Diagnosis Date Noted  . Prostate cancer ([place]) 11/30/2016  . Elevated PSA 10/13/2016     Past Surgical History:  Procedure Laterality Date  . MENISCUS REPAIR     x 3  . ROBOT ASSISTED LAPAROSCOPIC RADICAL PROSTATECTOMY N/A 12/29/2016   Procedure: ROBOTIC ASSISTED LAPAROSCOPIC RADICAL PROSTATECTOMY;  Surgeon: Hollice Espy, MD;  Location: [facility] ORS;  Service: Urology;  Laterality: N/A;     Prior to Admission medications   Medication Sig Start Date End Date Taking? Authorizing Provider   cefpodoxime (VANTIN) 100 MG tablet Take 1 tablet (100 mg total) by mouth 2 (two) times daily. 01/13/17   Carrie Mew, MD  dapagliflozin propanediol (FARXIGA) 10 MG TABS tablet Take 10 mg by mouth every morning.    [provider]  docusate sodium (COLACE) 100 MG capsule Take 1 capsule (100 mg total) by mouth 2 (two) times daily. 12/30/16   Hollice Espy, MD  insulin degludec (TRESIBA FLEXTOUCH) 100 UNIT/ML SOPN FlexTouch Pen Inject 10 Units into the skin every evening.     [provider]  metFORMIN (GLUCOPHAGE-XR) 500 MG 24 hr tablet Take 1,000 mg by mouth every evening.  01/16/16   [provider]  naproxen sodium (ANAPROX) 220 MG tablet Take 220-440 mg by mouth See admin instructions. 2 tablets every morning and 1 tablet every evening    [provider]  omeprazole (PRILOSEC) 20 MG capsule Take 20 mg by mouth daily.  09/17/16   [provider]  ondansetron (ZOFRAN ODT) 4 MG disintegrating tablet Take 1 tablet (4 mg total) by mouth every 8 (eight) hours as needed for nausea or vomiting. 01/13/17   Carrie Mew, MD  oxybutynin (DITROPAN) 5 MG tablet Take 1 tablet (5 mg total) by mouth every 8 (eight) hours as needed for bladder spasms. 12/30/16   Hollice Espy, MD  pioglitazone (ACTOS) 30 MG tablet Take 30 mg by mouth every evening.  01/16/16   [provider]  simvastatin (ZOCOR) 40 MG tablet Take 40 mg by mouth every evening.  09/17/16  [provider]  traMADol (ULTRAM) 50 MG tablet Take 1 tablet (50 mg total) by mouth every 6 (six) hours as needed. 12/30/16 12/30/17  Hollice Espy, MD     Allergies Patient has no known allergies.   Family History  Problem Relation Age of Onset  . Prostate cancer Father   . Stroke Father   . Stroke Mother   . Bladder Cancer Neg Hx   . Kidney cancer Neg Hx     Social History Social History  Substance Use Topics  . Smoking status: Never Smoker  . Smokeless tobacco: Former Systems developer     Types: Chew  . Alcohol use Yes     Comment: occas    Review of Systems  Constitutional:   Subjective fever. No chills.  ENT:   No sore throat. No rhinorrhea. Cardiovascular:   No chest pain or syncope. Respiratory:   No dyspnea or cough. Gastrointestinal:   Positive low abdominal pain. No vomiting or diarrhea. Positive nausea.  Musculoskeletal:  Positive bilateral flank pain All other systems reviewed and are negative except as documented above in ROS and HPI.  ____________________________________________   PHYSICAL EXAM:  VITAL SIGNS: ED Triage Vitals  Enc Vitals Group     BP 01/12/17 2214 134/73     Pulse Rate 01/12/17 2214 95     Resp 01/12/17 2214 18     Temp 01/12/17 2214 99.1 F (37.3 C)     Temp Source 01/12/17 2214 Oral     SpO2 01/12/17 2214 99 %     Weight 01/12/17 2215 186 lb (84.4 kg)     Height 01/12/17 2215 5\' 6"  (1.676 m)     Head Circumference --      Peak Flow --      Pain Score 01/12/17 2350 7     Pain Loc --      Pain Edu? --      Excl. in [place]? --     Vital signs reviewed, nursing assessments reviewed.   Constitutional:   Alert and oriented. Well appearing and in no distress. Eyes:   No scleral icterus.  EOMI. No nystagmus. No conjunctival pallor. PERRL. ENT   Head:   Normocephalic and atraumatic.   Nose:   No congestion/rhinnorhea.    Mouth/Throat:   MMM, no pharyngeal erythema. No peritonsillar mass.    Neck:   No meningismus. Full ROM Hematological/Lymphatic/Immunilogical:   No cervical lymphadenopathy. Cardiovascular:   RRR. Symmetric bilateral radial and DP pulses.  No murmurs.  Respiratory:   Normal respiratory effort without tachypnea/retractions. Breath sounds are clear and equal bilaterally. No wheezes/rales/rhonchi. Gastrointestinal:   Soft and nontender. Non distended. There is no CVA tenderness.  No rebound, rigidity, or guarding. Genitourinary:   Small amount of purulent discharge from the meatus Musculoskeletal:    Normal range of motion in all extremities. No joint effusions.  No lower extremity tenderness.  No edema. Neurologic:   Normal speech and language.  Motor grossly intact. No gross focal neurologic deficits are appreciated.  Skin:    Skin is warm, dry and intact. No rash noted.  No petechiae, purpura, or bullae.  ____________________________________________    LABS (pertinent positives/negatives) (all labs ordered are listed, but only abnormal results are displayed) Labs Reviewed  COMPREHENSIVE METABOLIC PANEL - Abnormal; Notable for the following:       Result Value   Glucose, Bld 221 (*)    BUN 21 (*)    All other components within normal limits  URINALYSIS, COMPLETE (  UACMP) WITH MICROSCOPIC - Abnormal; Notable for the following:    Color, Urine YELLOW (*)    APPearance CLOUDY (*)    Glucose, UA >=500 (*)    Hgb urine dipstick LARGE (*)    Protein, ur 30 (*)    Leukocytes, UA LARGE (*)    Bacteria, UA RARE (*)    All other components within normal limits  URINE CULTURE  CBC   ____________________________________________   EKG    ____________________________________________    RADIOLOGY  No results found.  ____________________________________________   PROCEDURES Procedures  ____________________________________________   INITIAL IMPRESSION / ASSESSMENT AND PLAN / ED COURSE  Pertinent labs & imaging results that were available during my care of the patient were reviewed by me and considered in my medical decision making (see chart for details).  Patient well appearing no acute distress normal vital signs. Urinalysis suggestive of UTI although this is somewhat difficult to interpret in the postoperative setting. Urine culture sent. I'll give the patient ceftriaxone in the ED and start him on Vantin. Zofran as needed. Continue pain control at home. Follow-up with primary care and urology.Considering the patient's symptoms, medical history, and physical  examination today, I have low suspicion for cholecystitis or biliary pathology, pancreatitis, perforation or bowel obstruction, hernia, intra-abdominal abscess, AAA or dissection, volvulus or intussusception, mesenteric ischemia, or appendicitis.  Bladder scan 115.        ____________________________________________   FINAL CLINICAL IMPRESSION(S) / ED DIAGNOSES  Final diagnoses:  Lower urinary tract infectious disease  Dysuria      New Prescriptions   CEFPODOXIME (VANTIN) 100 MG TABLET    Take 1 tablet (100 mg total) by mouth 2 (two) times daily.   ONDANSETRON (ZOFRAN ODT) 4 MG DISINTEGRATING TABLET    Take 1 tablet (4 mg total) by mouth every 8 (eight) hours as needed for nausea or vomiting.     Portions of this note were generated with dragon dictation software. Dictation errors may occur despite best attempts at proofreading.    Carrie Mew, MD 01/13/17 [phone]

## 2017-01-13 NOTE — Telephone Encounter (Signed)
Patient called in to let Dr. Erlene Quan know he went to the ED last night b/c of UTI. Advised Dr. Erlene Quan will not be in the office until Monday but would sent note to her assistant. Pt verbalized understanding.

## 2017-01-13 NOTE — ED Notes (Signed)
Bladder scanner shows that the Pt has 125mls. MD notified.

## 2017-01-15 LAB — URINE CULTURE

## 2017-01-15 NOTE — Telephone Encounter (Signed)
noted 

## 2017-01-26 ENCOUNTER — Other Ambulatory Visit: Payer: BC Managed Care – PPO

## 2017-01-26 DIAGNOSIS — C61 Malignant neoplasm of prostate: Secondary | ICD-10-CM

## 2017-01-27 LAB — PSA: Prostate Specific Ag, Serum: <0.1 ng/mL (ref 0.0–4.0)

## 2017-01-28 ENCOUNTER — Ambulatory Visit (INDEPENDENT_AMBULATORY_CARE_PROVIDER_SITE_OTHER): Payer: BC Managed Care – PPO | Admitting: Urology

## 2017-01-28 ENCOUNTER — Encounter: Payer: Self-pay | Admitting: Urology

## 2017-01-28 VITALS — BP 136/67 | HR 68 | Ht 66.0 in | Wt 191.0 lb

## 2017-01-28 DIAGNOSIS — C61 Malignant neoplasm of prostate: Secondary | ICD-10-CM

## 2017-01-28 DIAGNOSIS — N393 Stress incontinence (female) (male): Secondary | ICD-10-CM

## 2017-01-28 DIAGNOSIS — N5231 Erectile dysfunction following radical prostatectomy: Secondary | ICD-10-CM

## 2017-01-28 LAB — URINALYSIS, COMPLETE
Bilirubin, UA: NEGATIVE
Ketones, UA: NEGATIVE
LEUKOCYTES UA: NEGATIVE
NITRITE UA: NEGATIVE
PH UA: 5.5 (ref 5.0–7.5)
Protein, UA: NEGATIVE
RBC, UA: NEGATIVE
Specific Gravity, UA: <1.005 — ABNORMAL LOW (ref 1.005–1.030)
UUROB: 0.2 mg/dL (ref 0.2–1.0)

## 2017-01-28 MED ORDER — SILDENAFIL CITRATE 20 MG PO TABS: 20.0000 mg | ORAL_TABLET | ORAL | 11 refills | Status: DC | PRN

## 2017-01-28 NOTE — Progress Notes (Signed)
01/28/2017 2:13 PM   Keith Holland 1956-02-19 [id number]  Referring provider: Ezequiel Kayser, MD [place] [facility] [place],  [address]  Chief Complaint  Patient presents with  . Prostate Cancer    7month    HPI: 61 year old male who presents today one month following radical robotic prostatectomy. He was initially diagnosed with Gleason 3+3 prostate cancer, PSA 9.5 disease. He elected to proceed with radical robotic prostatectomy which was performed on 12/29/2016.  Lymphadenectomy was not performed based on risk stratification. He returns today for routine follow-up.  Surgical pathology was consistent with Gleason 3+4, pT3aNxMx with focal microscopic invasion of the bladder neck with a unifocal 1 mm positive margin at this level. + extraprostatic extension at the bladder neck,+ bladder neck margin (right), - LVI, +PNI.  He was treated for urinary tract infection following surgery. His dysuria has since resolved.  He notes today that he leaks 4-5 diapers a day. He has seen significant improvement over the past week including overnight. He's had no erections since surgery.  His incisions seem to be healing well.  No drainage, redness, or any other concerns.  PSA on 01/26/17 <0.1.  PMH: Past Medical History:  Diagnosis Date  . Arthritis   . Complication of anesthesia    severe n/v  . Diabetes mellitus without complication ([place])   . GERD (gastroesophageal reflux disease)   . Hyperlipidemia   . PONV (postoperative nausea and vomiting)   . SAH (subarachnoid hemorrhage) ([place])    spontaneous 2000 n surgery  . Vertigo    since childhood if on back long will get vertigo     Surgical History: Past Surgical History:  Procedure Laterality Date  . MENISCUS REPAIR     x 3  . ROBOT ASSISTED LAPAROSCOPIC RADICAL PROSTATECTOMY N/A 12/29/2016   Procedure: ROBOTIC ASSISTED LAPAROSCOPIC RADICAL PROSTATECTOMY;  Surgeon: Hollice Espy, MD;  Location: [facility]  ORS;  Service: Urology;  Laterality: N/A;    Home Medications:  Allergies as of 01/28/2017   No Known Allergies     Medication List       Accurate as of 01/28/17  2:13 PM. Always use your most recent med list.          FARXIGA 10 MG Tabs tablet Generic drug:  dapagliflozin propanediol Take 10 mg by mouth every morning.   metFORMIN 500 MG 24 hr tablet Commonly known as:  GLUCOPHAGE-XR Take 1,000 mg by mouth every evening.   naproxen sodium 220 MG tablet Commonly known as:  ANAPROX Take 220-440 mg by mouth See admin instructions. 2 tablets every morning and 1 tablet every evening   omeprazole 20 MG capsule Commonly known as:  PRILOSEC Take 20 mg by mouth daily.   pioglitazone 30 MG tablet Commonly known as:  ACTOS Take 30 mg by mouth every evening.   sildenafil 20 MG tablet Commonly known as:  REVATIO Take 1 tablet (20 mg total) by mouth as needed. Take 1-5 tabs as needed prior to intercourse   simvastatin 40 MG tablet Commonly known as:  ZOCOR Take 40 mg by mouth every evening.   TRESIBA FLEXTOUCH 100 UNIT/ML Sopn FlexTouch Pen Generic drug:  insulin degludec Inject 10 Units into the skin every evening.       Allergies: No Known Allergies  Family History: Family History  Problem Relation Age of Onset  . Prostate cancer Father   . Stroke Father   . Stroke Mother   . Bladder Cancer Neg Hx   .  Kidney cancer Neg Hx     Social History:  reports that he has never smoked. He has quit using smokeless tobacco. His smokeless tobacco use included Chew. He reports that he drinks alcohol. He reports that he does not use drugs.  ROS: UROLOGY Frequent Urination?: No Hard to postpone urination?: Yes Burning/pain with urination?: No Get up at night to urinate?: Yes Leakage of urine?: Yes Urine stream starts and stops?: No Trouble starting stream?: No Do you have to strain to urinate?: No Blood in urine?: No Urinary tract infection?: No Sexually transmitted  disease?: No Injury to kidneys or bladder?: No Painful intercourse?: No Weak stream?: No Erection problems?: Yes Penile pain?: No  Gastrointestinal Nausea?: No Vomiting?: No Indigestion/heartburn?: No Diarrhea?: No Constipation?: No  Constitutional Fever: No Night sweats?: Yes Weight loss?: No Fatigue?: No  Skin Skin rash/lesions?: No Itching?: No  Eyes Blurred vision?: No Double vision?: No  Ears/Nose/Throat Sore throat?: No Sinus problems?: No  Hematologic/Lymphatic Swollen glands?: No Easy bruising?: No  Cardiovascular Leg swelling?: No Chest pain?: No  Respiratory Cough?: No Shortness of breath?: No  Endocrine Excessive thirst?: No  Musculoskeletal Back pain?: Yes Joint pain?: Yes  Neurological Headaches?: No Dizziness?: No  Psychologic Depression?: No Anxiety?: No  Physical Exam: BP 136/67   Pulse 68   Ht 5\' 6"  (1.676 m)   Wt 191 lb (86.6 kg)   BMI 30.83 kg/m   Constitutional:  Alert and oriented, No acute distress. HEENT: [place] AT, moist mucus membranes.  Trachea midline, no masses. Cardiovascular: No clubbing, cyanosis, or edema. Respiratory: Normal respiratory effort, no increased work of breathing. GI: Abdomen is soft, nontender, nondistended, no abdominal masses.  Incision healing well. Neurologic: Grossly intact, no focal deficits, moving all 4 extremities. Psychiatric: Normal mood and affect.  Laboratory Data: Lab Results  Component Value Date   WBC 9.9 01/12/2017   HGB 13.4 01/12/2017   HCT 41.0 01/12/2017   MCV 91.0 01/12/2017   PLT 347 01/12/2017    Lab Results  Component Value Date   CREATININE 1.00 01/12/2017    Urinalysis Results for orders placed or performed in visit on 01/28/17  Urinalysis, Complete  Result Value Ref Range   Specific Gravity, UA <1.005 (L) 1.005 - 1.030   pH, UA 5.5 5.0 - 7.5   Color, UA Yellow Yellow   Appearance Ur Clear Clear   Leukocytes, UA Negative Negative   Protein, UA Negative  Negative/Trace   Glucose, UA 3+ (A) Negative   Ketones, UA Negative Negative   RBC, UA Negative Negative   Bilirubin, UA Negative Negative   Urobilinogen, Ur 0.2 0.2 - 1.0 mg/dL   Nitrite, UA Negative Negative    Pertinent Imaging: n/a  Assessment & Plan:    1. Prostate cancer [facility]) Surgical pathology reviewed with the patient,  pTa with + BN invasion (microscopic invasion), + ECE Based on his adverse features, I recommended adjuvant radiation Given that his PSA remains undetectable at this time, we'll hold off on pursuing this until his continence has improved All questions answered RTC 3 months with PSA  2. SUI (stress urinary incontinence), male Current stress return to pelvic floor exercises Patient was reassured No evidence of ongoing infection as contributing factor - Urinalysis, Complete  3. Erectile dysfunction after radical prostatectomy Strongly encouraged penile rehabilitation discussed sildenafil therapy, prescribed today Penile stretching Discussed ICI, will let us know if he'd like to pursue this   Return in about 3 months (around 04/30/2017) for PSA.  Caryl Pina  Erlene Quan, [place] [address] [place], [place] [address] [phone]

## 2017-01-29 ENCOUNTER — Telehealth: Payer: Self-pay

## 2017-01-29 NOTE — Telephone Encounter (Signed)
Spoke to patient. Gave results. Patient verbalized understanding. 

## 2017-01-29 NOTE — Telephone Encounter (Signed)
-----   Message from Hollice Espy, MD sent at 01/28/2017  8:16 PM EDT ----- Urine looks good.  No infection.  Work on your blood sugar control.  You still have sugar in your urine.    Hollice Espy, MD

## 2017-04-27 ENCOUNTER — Other Ambulatory Visit: Payer: Self-pay

## 2017-04-27 ENCOUNTER — Other Ambulatory Visit: Payer: BC Managed Care – PPO

## 2017-04-27 DIAGNOSIS — C61 Malignant neoplasm of prostate: Secondary | ICD-10-CM

## 2017-04-28 LAB — PSA

## 2017-04-29 ENCOUNTER — Encounter: Payer: Self-pay | Admitting: Urology

## 2017-04-29 ENCOUNTER — Ambulatory Visit (INDEPENDENT_AMBULATORY_CARE_PROVIDER_SITE_OTHER): Payer: BC Managed Care – PPO | Admitting: Urology

## 2017-04-29 VITALS — BP 110/62 | HR 69 | Ht 66.0 in | Wt 192.0 lb

## 2017-04-29 DIAGNOSIS — N393 Stress incontinence (female) (male): Secondary | ICD-10-CM | POA: Diagnosis not present

## 2017-04-29 DIAGNOSIS — N5231 Erectile dysfunction following radical prostatectomy: Secondary | ICD-10-CM | POA: Diagnosis not present

## 2017-04-29 DIAGNOSIS — C61 Malignant neoplasm of prostate: Secondary | ICD-10-CM

## 2017-04-29 DIAGNOSIS — N4340 Spermatocele of epididymis, unspecified: Secondary | ICD-10-CM | POA: Diagnosis not present

## 2017-04-29 NOTE — Progress Notes (Signed)
04/29/2017 9:54 PM   Keith Holland Jun 16, 1956 [id number]  Referring provider: Ezequiel Kayser, MD [place] [facility] [place], [place] [address]  Chief Complaint  Patient presents with  . Prostate Cancer    15month    HPI: 61 year old male who s/p radical robotic prostatectomy 12/2016 who returns today for routine follow up.    He was initially diagnosed with Gleason 3+3 prostate cancer, PSA 9.5 disease. He elected to proceed with radical robotic prostatectomy which was performed on 12/29/2016.  Lymphadenectomy was not performed based on risk stratification.   Surgical pathology was consistent with Gleason 3+4, pT3aNxMx with focal microscopic invasion of the bladder neck with a unifocal 1 mm positive margin at this level. + extraprostatic extension at the bladder neck,+ bladder neck margin (right), - LVI, +PNI.  He's had fairly significant improvement in his urinary continence. He wears up to 2 depends a day which are not completely saturated. He is mostly dry overnight. He leaks when he is physically active but significantly less, only 1 pad per day.    He also reports that he started having partial penile fullness augmented by sildenafil.  PSA as of 04/27/2017 continues to remain undetectable, <0.1  PMH: Past Medical History:  Diagnosis Date  . Arthritis   . Complication of anesthesia    severe n/v  . Diabetes mellitus without complication ([place])   . GERD (gastroesophageal reflux disease)   . Hyperlipidemia   . PONV (postoperative nausea and vomiting)   . SAH (subarachnoid hemorrhage) ([place])    spontaneous 2000 n surgery  . Vertigo    since childhood if on back long will get vertigo     Surgical History: Past Surgical History:  Procedure Laterality Date  . MENISCUS REPAIR     x 3  . ROBOT ASSISTED LAPAROSCOPIC RADICAL PROSTATECTOMY N/A 12/29/2016   Procedure: ROBOTIC ASSISTED LAPAROSCOPIC RADICAL PROSTATECTOMY;  Surgeon: Keith Espy, MD;   Location: [facility] ORS;  Service: Urology;  Laterality: N/A;    Home Medications:  Allergies as of 04/29/2017   No Known Allergies     Medication List       Accurate as of 04/29/17  9:54 PM. Always use your most recent med list.          metFORMIN 500 MG 24 hr tablet Commonly known as:  GLUCOPHAGE-XR Take 1,000 mg by mouth every evening.   naproxen sodium 220 MG tablet Commonly known as:  ANAPROX Take 220-440 mg by mouth See admin instructions. 2 tablets every morning and 1 tablet every evening   omeprazole 20 MG capsule Commonly known as:  PRILOSEC Take 20 mg by mouth daily.   pioglitazone 30 MG tablet Commonly known as:  ACTOS Take 30 mg by mouth every evening.   sildenafil 20 MG tablet Commonly known as:  REVATIO Take 1 tablet (20 mg total) by mouth as needed. Take 1-5 tabs as needed prior to intercourse   simvastatin 40 MG tablet Commonly known as:  ZOCOR Take 40 mg by mouth every evening.   TRESIBA FLEXTOUCH 100 UNIT/ML Sopn FlexTouch Pen Generic drug:  insulin degludec Inject 10 Units into the skin every evening.       Allergies: No Known Allergies  Family History: Family History  Problem Relation Age of Onset  . Prostate cancer Father   . Stroke Father   . Stroke Mother   . Bladder Cancer Neg Hx   . Kidney cancer Neg Hx     Social History:  reports that he has never smoked. He has quit using smokeless tobacco. His smokeless tobacco use included Chew. He reports that he drinks alcohol. He reports that he does not use drugs.  ROS: UROLOGY Frequent Urination?: No Hard to postpone urination?: Yes Burning/pain with urination?: No Get up at night to urinate?: Yes Leakage of urine?: Yes Urine stream starts and stops?: No Trouble starting stream?: No Do you have to strain to urinate?: No Blood in urine?: No Urinary tract infection?: No Sexually transmitted disease?: No Injury to kidneys or bladder?: No Painful intercourse?: No Weak stream?:  No Erection problems?: Yes Penile pain?: No  Gastrointestinal Nausea?: No Vomiting?: No Indigestion/heartburn?: No Diarrhea?: No Constipation?: Yes  Constitutional Fever: No Night sweats?: No Weight loss?: No Fatigue?: Yes  Skin Skin rash/lesions?: No Itching?: No  Eyes Blurred vision?: No Double vision?: No  Ears/Nose/Throat Sore throat?: No Sinus problems?: No  Hematologic/Lymphatic Swollen glands?: No Easy bruising?: No  Cardiovascular Leg swelling?: No Chest pain?: No  Respiratory Cough?: No Shortness of breath?: No  Endocrine Excessive thirst?: No  Musculoskeletal Back pain?: Yes Joint pain?: Yes  Neurological Headaches?: No Dizziness?: No  Psychologic Depression?: No Anxiety?: No  Physical Exam: BP 110/62   Pulse 69   Ht 5\' 6"  (1.676 m)   Wt 192 lb (87.1 kg)   BMI 30.99 kg/m   Constitutional:  Alert and oriented, No acute distress.  Accompanied by wife today. HEENT: Keith Holland AT, moist mucus membranes.  Trachea midline, no masses. Cardiovascular: No clubbing, cyanosis, or edema. Respiratory: Normal respiratory effort, no increased work of breathing. GI: Abdomen is soft, nontender, nondistended, no abdominal masses.  Incision healing well. Neurologic: Grossly intact, no focal deficits, moving all 4 extremities. Psychiatric: Normal mood and affect.  Laboratory Data: Lab Results  Component Value Date   WBC 9.9 01/12/2017   HGB 13.4 01/12/2017   HCT 41.0 01/12/2017   MCV 91.0 01/12/2017   PLT 347 01/12/2017    Lab Results  Component Value Date   CREATININE 1.00 01/12/2017    Urinalysis Results for orders placed or performed in visit on 04/27/17  PSA  Result Value Ref Range   Prostate Specific Ag, Serum <0.1 0.0 - 4.0 ng/mL    Pertinent Imaging: n/a  Assessment & Plan:    1. Prostate cancer ([place])  s/p RALP + BPLND, Gleason 3+4 pT3a with + BN invasion (microscopic invasion), + ECE Based on his adverse features, I continue to  recommended adjuvant radiation, risk of side effects/ continence issues vs. improved oncological outcomes discussed PSA remains undetectable Doing well with continence recovery, will go ahead and refer to rad oncology (Keith Holland) to plan for adjvant XRT and select help select optimal timing All questions answered RTC 3 months with PSA  2. SUI (stress urinary incontinence), male Improving, 1-2 depends daily Anticipate further improvement with time  Continue to recommend Kegels  3. Erectile dysfunction after radical prostatectomy Improving with penile fullness Continue sildenafil prn Penile stretching Discussed ICI again today at length, will let us know if he'd like to pursue this    Return for 3 month PSA lab only, then MD with PSA in  6 months.  Keith Espy, MD  [facility] [address] [place],  [address] [phone]  I spent 25 min with this patient of which greater than 50% was spent in counseling and coordination of care with the patient.

## 2017-05-14 ENCOUNTER — Ambulatory Visit
Admission: RE | Admit: 2017-05-14 | Discharge: 2017-05-14 | Disposition: A | Discharge status: Home / Self Care | Payer: BC Managed Care – PPO | Source: Ambulatory Visit | Attending: Radiation Oncology | Admitting: Radiation Oncology

## 2017-05-14 ENCOUNTER — Encounter: Payer: Self-pay | Admitting: Radiation Oncology

## 2017-05-14 VITALS — BP 129/75 | HR 59 | Temp 98.2°F | Resp 12 | Ht 66.0 in | Wt 193.3 lb

## 2017-05-14 DIAGNOSIS — C61 Malignant neoplasm of prostate: Secondary | ICD-10-CM | POA: Diagnosis not present

## 2017-05-14 DIAGNOSIS — E785 Hyperlipidemia, unspecified: Secondary | ICD-10-CM | POA: Diagnosis not present

## 2017-05-14 DIAGNOSIS — E119 Type 2 diabetes mellitus without complications: Secondary | ICD-10-CM | POA: Insufficient documentation

## 2017-05-14 DIAGNOSIS — N529 Male erectile dysfunction, unspecified: Secondary | ICD-10-CM | POA: Insufficient documentation

## 2017-05-14 DIAGNOSIS — Z794 Long term (current) use of insulin: Secondary | ICD-10-CM | POA: Insufficient documentation

## 2017-05-14 DIAGNOSIS — K219 Gastro-esophageal reflux disease without esophagitis: Secondary | ICD-10-CM | POA: Insufficient documentation

## 2017-05-14 DIAGNOSIS — R42 Dizziness and giddiness: Secondary | ICD-10-CM | POA: Insufficient documentation

## 2017-05-14 DIAGNOSIS — M129 Arthropathy, unspecified: Secondary | ICD-10-CM | POA: Insufficient documentation

## 2017-05-14 DIAGNOSIS — Z87820 Personal history of traumatic brain injury: Secondary | ICD-10-CM | POA: Diagnosis not present

## 2017-05-14 DIAGNOSIS — Z87891 Personal history of nicotine dependence: Secondary | ICD-10-CM | POA: Diagnosis not present

## 2017-05-14 DIAGNOSIS — Z9079 Acquired absence of other genital organ(s): Secondary | ICD-10-CM | POA: Diagnosis not present

## 2017-05-14 DIAGNOSIS — Z510 Encounter for antineoplastic radiation therapy: Secondary | ICD-10-CM | POA: Diagnosis not present

## 2017-05-14 DIAGNOSIS — Z79899 Other long term (current) drug therapy: Secondary | ICD-10-CM | POA: Insufficient documentation

## 2017-05-14 DIAGNOSIS — C7911 Secondary malignant neoplasm of bladder: Secondary | ICD-10-CM | POA: Insufficient documentation

## 2017-05-14 NOTE — Consult Note (Signed)
NEW PATIENT EVALUATION  Name: Keith Holland  MRN: [id number]  Date:   05/14/2017     DOB: [birth year]   This 61 y.o. 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  REFERRING PHYSICIAN: Ezequiel Kayser, MD  CHIEF COMPLAINT:  Chief Complaint  Patient presents with  . Prostate Cancer    initial visit    DIAGNOSIS: The encounter diagnosis was Malignant neoplasm of prostate ([place]).   PREVIOUS INVESTIGATIONS:  Pathology reports reviewed Clinical notes reviewed   HPI: Patient is a 61 year old male who presented with a rising PSA to 9.5. He underwent transrectal ultrasound guided biopsy positive for Gleason 6 (3+3) adenocarcinoma the prostate. He underwent a robotic prostatectomy 12/29/2016. Surgical pathology showed Gleason 7 (3+4) with focal microscopic invasion the bladder neck with positive margin at this level. There was also extraprostatic extension at the bladder neck. Seminal vesicle region was clear. Perineural invasion was present no lymphovascular invasion was noted. No lymph nodes were submitted based on low risk factor for pelvic nodal involvement. Postoperatively his PSAs have remained undetectable. He does have urinary incontinence and does wear a pad although this has improved over time. He does have erectile dysfunction although is taking Viagra with some good results. He is referred today for radiation oncology for consideration of salvage treatment. He is otherwise doing well. He is accompanied today by his wife  PLANNED TREATMENT REGIMEN: Salvage ration therapy to prostatic fossa  PAST MEDICAL HISTORY:  has a past medical history of Arthritis; Complication of anesthesia; Diabetes mellitus without complication ([place]); GERD (gastroesophageal reflux disease); Hyperlipidemia; PONV (postoperative nausea and vomiting); SAH (subarachnoid  hemorrhage) ([place]); and Vertigo.    PAST SURGICAL HISTORY:  Past Surgical History:  Procedure Laterality Date  . MENISCUS REPAIR     x 3  . ROBOT ASSISTED LAPAROSCOPIC RADICAL PROSTATECTOMY N/A 12/29/2016   Procedure: ROBOTIC ASSISTED LAPAROSCOPIC RADICAL PROSTATECTOMY;  Surgeon: Hollice Espy, MD;  Location: [facility] ORS;  Service: Urology;  Laterality: N/A;    FAMILY HISTORY: family history includes Prostate cancer in his father; Stroke in his father and mother.  SOCIAL HISTORY:  reports that he has never smoked. He has quit using smokeless tobacco. His smokeless tobacco use included Chew. He reports that he drinks alcohol. He reports that he does not use drugs.  ALLERGIES: Patient has no known allergies.  MEDICATIONS:  Current Outpatient Prescriptions  Medication Sig Dispense Refill  . Insulin Degludec (TRESIBA FLEXTOUCH) 200 UNIT/ML SOPN Inject 16 Units into the skin every evening.    . metFORMIN (GLUCOPHAGE-XR) 500 MG 24 hr tablet Take 1,000 mg by mouth every evening.     . naproxen sodium (ANAPROX) 220 MG tablet Take 220-440 mg by mouth See admin instructions. 2 tablets every morning and 1 tablet every evening    . omeprazole (PRILOSEC) 20 MG capsule Take 20 mg by mouth daily.     . pioglitazone (ACTOS) 30 MG tablet Take 30 mg by mouth every evening.     . sildenafil (REVATIO) 20 MG tablet Take 1 tablet (20 mg total) by mouth as needed. Take 1-5 tabs as needed prior to intercourse 30 tablet 11  . simvastatin (ZOCOR) 40 MG tablet Take 40 mg by mouth every evening.      No current facility-administered medications for this encounter.     ECOG PERFORMANCE STATUS:  1 - Symptomatic but completely ambulatory  REVIEW OF  SYSTEMS: Except for the urinary continence erectile dysfunction Patient denies any weight loss, fatigue, weakness, fever, chills or night sweats. Patient denies any loss of vision, blurred vision. Patient denies any ringing  of the ears or hearing loss. No irregular  heartbeat. Patient denies heart murmur or history of fainting. Patient denies any chest pain or pain radiating to her upper extremities. Patient denies any shortness of breath, difficulty breathing at night, cough or hemoptysis. Patient denies any swelling in the lower legs. Patient denies any nausea vomiting, vomiting of blood, or coffee ground material in the vomitus. Patient denies any stomach pain. Patient states has had normal bowel movements no significant constipation or diarrhea. Patient denies any dysuria, hematuria or significant nocturia. Patient denies any problems walking, swelling in the joints or loss of balance. Patient denies any skin changes, loss of hair or loss of weight. Patient denies any excessive worrying or anxiety or significant depression. Patient denies any problems with insomnia. Patient denies excessive thirst, polyuria, polydipsia. Patient denies any swollen glands, patient denies easy bruising or easy bleeding. Patient denies any recent infections, allergies or URI. Patient "s visual fields have not changed significantly in recent time.    PHYSICAL EXAM: BP 129/75 (BP Location: Left Arm, Patient Position: Sitting, Cuff Size: Large)   Pulse (!) 59   Temp 98.2 F (36.8 C) (Tympanic)   Resp 12   Ht 5\' 6"  (1.676 m)   Wt 193 lb 5.5 oz (87.7 kg)   BMI 31.21 kg/m  On rectal exam rectal sphincter tone is good there some firmness in the 8:00 position although not thought to be residual clinical disease.Well-developed well-nourished patient in NAD. HEENT reveals PERLA, EOMI, discs not visualized.  Oral cavity is clear. No oral mucosal lesions are identified. Neck is clear without evidence of cervical or supraclavicular adenopathy. Lungs are clear to A&P. Cardiac examination is essentially unremarkable with regular rate and rhythm without murmur rub or thrill. Abdomen is benign with no organomegaly or masses noted. Motor sensory and DTR levels are equal and symmetric in the upper  and lower extremities. Cranial nerves II through XII are grossly intact. Proprioception is intact. No peripheral adenopathy or edema is identified. No motor or sensory levels are noted. Crude visual fields are within normal range.  LABORATORY DATA: Pathology reports reviewed    RADIOLOGY RESULTS: No current films for review   IMPRESSION: stage III (T3a N0 M0) Gleason 7 (3+4) adenocarcinoma the prostate status post robotic prostatectomy in 61 year old male  PLAN: At this time I would recommend salvage radiation therapy to his prostatic fossa. I would plan on delivering 7600 cGy using I MRT radiation therapy. I go to a slightly higher dose than a standard based on my personal clinical experience ability to deliver focused radiation with I MRT treatment planning and delivery. Risks and benefits of treatment including increased lower urinary tract symptoms such as urgency frequency, fatigue, alteration of blood counts possible worsening of erectile dysfunction all were discussed in detail with the patient and his wife. I've given him the option to wait several months although he would like to begin before the first of the year and I personally set up and ordered CT simulation for next week. They both seem to comprehend my treatment plan well.  I would like to take this opportunity to thank you for allowing me to participate in the care of your patient.Armstead Peaks., MD

## 2017-05-14 NOTE — Progress Notes (Signed)
Patient here for initial visit. Prostate CA.

## 2017-05-19 ENCOUNTER — Ambulatory Visit
Admission: RE | Admit: 2017-05-19 | Discharge: 2017-05-19 | Disposition: A | Discharge status: Home / Self Care | Payer: BC Managed Care – PPO | Source: Ambulatory Visit | Attending: Radiation Oncology | Admitting: Radiation Oncology

## 2017-05-19 DIAGNOSIS — C61 Malignant neoplasm of prostate: Secondary | ICD-10-CM | POA: Diagnosis not present

## 2017-05-22 ENCOUNTER — Other Ambulatory Visit: Payer: Self-pay | Admitting: *Deleted

## 2017-05-22 DIAGNOSIS — C61 Malignant neoplasm of prostate: Secondary | ICD-10-CM

## 2017-05-25 DIAGNOSIS — C61 Malignant neoplasm of prostate: Secondary | ICD-10-CM | POA: Diagnosis not present

## 2017-05-28 ENCOUNTER — Ambulatory Visit
Admission: RE | Admit: 2017-05-28 | Discharge: 2017-05-28 | Disposition: A | Discharge status: Home / Self Care | Payer: BC Managed Care – PPO | Source: Ambulatory Visit | Attending: Radiation Oncology | Admitting: Radiation Oncology

## 2017-06-01 ENCOUNTER — Ambulatory Visit
Admission: RE | Admit: 2017-06-01 | Discharge: 2017-06-01 | Disposition: A | Discharge status: Home / Self Care | Payer: BC Managed Care – PPO | Source: Ambulatory Visit | Attending: Radiation Oncology | Admitting: Radiation Oncology

## 2017-06-01 DIAGNOSIS — C61 Malignant neoplasm of prostate: Secondary | ICD-10-CM | POA: Diagnosis not present

## 2017-06-02 ENCOUNTER — Ambulatory Visit
Admission: RE | Admit: 2017-06-02 | Discharge: 2017-06-02 | Disposition: A | Discharge status: Home / Self Care | Payer: BC Managed Care – PPO | Source: Ambulatory Visit | Attending: Radiation Oncology | Admitting: Radiation Oncology

## 2017-06-02 DIAGNOSIS — C61 Malignant neoplasm of prostate: Secondary | ICD-10-CM | POA: Diagnosis not present

## 2017-06-03 ENCOUNTER — Ambulatory Visit
Admission: RE | Admit: 2017-06-03 | Discharge: 2017-06-03 | Disposition: A | Discharge status: Home / Self Care | Payer: BC Managed Care – PPO | Source: Ambulatory Visit | Attending: Radiation Oncology | Admitting: Radiation Oncology

## 2017-06-03 DIAGNOSIS — C61 Malignant neoplasm of prostate: Secondary | ICD-10-CM | POA: Diagnosis not present

## 2017-06-08 ENCOUNTER — Ambulatory Visit
Admission: RE | Admit: 2017-06-08 | Discharge: 2017-06-08 | Disposition: A | Discharge status: Home / Self Care | Payer: BC Managed Care – PPO | Source: Ambulatory Visit | Attending: Radiation Oncology | Admitting: Radiation Oncology

## 2017-06-08 DIAGNOSIS — C61 Malignant neoplasm of prostate: Secondary | ICD-10-CM | POA: Diagnosis not present

## 2017-06-09 ENCOUNTER — Ambulatory Visit
Admission: RE | Admit: 2017-06-09 | Discharge: 2017-06-09 | Disposition: A | Discharge status: Home / Self Care | Payer: BC Managed Care – PPO | Source: Ambulatory Visit | Attending: Radiation Oncology | Admitting: Radiation Oncology

## 2017-06-09 DIAGNOSIS — C61 Malignant neoplasm of prostate: Secondary | ICD-10-CM | POA: Diagnosis not present

## 2017-06-10 ENCOUNTER — Ambulatory Visit
Admission: RE | Admit: 2017-06-10 | Discharge: 2017-06-10 | Disposition: A | Discharge status: Home / Self Care | Payer: BC Managed Care – PPO | Source: Ambulatory Visit | Attending: Radiation Oncology | Admitting: Radiation Oncology

## 2017-06-10 DIAGNOSIS — C61 Malignant neoplasm of prostate: Secondary | ICD-10-CM | POA: Diagnosis not present

## 2017-06-11 ENCOUNTER — Ambulatory Visit
Admission: RE | Admit: 2017-06-11 | Discharge: 2017-06-11 | Disposition: A | Discharge status: Home / Self Care | Payer: BC Managed Care – PPO | Source: Ambulatory Visit | Attending: Radiation Oncology | Admitting: Radiation Oncology

## 2017-06-11 DIAGNOSIS — C61 Malignant neoplasm of prostate: Secondary | ICD-10-CM | POA: Diagnosis not present

## 2017-06-12 ENCOUNTER — Ambulatory Visit
Admission: RE | Admit: 2017-06-12 | Discharge: 2017-06-12 | Disposition: A | Discharge status: Home / Self Care | Payer: BC Managed Care – PPO | Source: Ambulatory Visit | Attending: Radiation Oncology | Admitting: Radiation Oncology

## 2017-06-12 DIAGNOSIS — C61 Malignant neoplasm of prostate: Secondary | ICD-10-CM | POA: Diagnosis not present

## 2017-06-15 ENCOUNTER — Ambulatory Visit
Admission: RE | Admit: 2017-06-15 | Discharge: 2017-06-15 | Disposition: A | Discharge status: Home / Self Care | Payer: BC Managed Care – PPO | Source: Ambulatory Visit | Attending: Radiation Oncology | Admitting: Radiation Oncology

## 2017-06-15 ENCOUNTER — Inpatient Hospital Stay: Payer: BC Managed Care – PPO | Attending: Radiation Oncology

## 2017-06-15 DIAGNOSIS — C61 Malignant neoplasm of prostate: Secondary | ICD-10-CM | POA: Insufficient documentation

## 2017-06-15 LAB — CBC
HEMATOCRIT: 39.4 % — AB (ref 40.0–52.0)
Hemoglobin: 13.1 g/dL (ref 13.0–18.0)
MCH: 30.5 pg (ref 26.0–34.0)
MCHC: 33.4 g/dL (ref 32.0–36.0)
MCV: 91.2 fL (ref 80.0–100.0)
PLATELETS: 188 10*3/uL (ref 150–440)
RBC: 4.32 MIL/uL — AB (ref 4.40–5.90)
RDW: 14.4 % (ref 11.5–14.5)
WBC: 4.2 10*3/uL (ref 3.8–10.6)

## 2017-06-16 ENCOUNTER — Ambulatory Visit
Admission: RE | Admit: 2017-06-16 | Discharge: 2017-06-16 | Disposition: A | Discharge status: Home / Self Care | Payer: BC Managed Care – PPO | Source: Ambulatory Visit | Attending: Radiation Oncology | Admitting: Radiation Oncology

## 2017-06-16 DIAGNOSIS — C61 Malignant neoplasm of prostate: Secondary | ICD-10-CM | POA: Diagnosis not present

## 2017-06-17 ENCOUNTER — Ambulatory Visit
Admission: RE | Admit: 2017-06-17 | Discharge: 2017-06-17 | Disposition: A | Discharge status: Home / Self Care | Payer: BC Managed Care – PPO | Source: Ambulatory Visit | Attending: Radiation Oncology | Admitting: Radiation Oncology

## 2017-06-17 DIAGNOSIS — C61 Malignant neoplasm of prostate: Secondary | ICD-10-CM | POA: Diagnosis not present

## 2017-06-18 ENCOUNTER — Ambulatory Visit
Admission: RE | Admit: 2017-06-18 | Discharge: 2017-06-18 | Disposition: A | Discharge status: Home / Self Care | Payer: BC Managed Care – PPO | Source: Ambulatory Visit | Attending: Radiation Oncology | Admitting: Radiation Oncology

## 2017-06-18 DIAGNOSIS — C61 Malignant neoplasm of prostate: Secondary | ICD-10-CM | POA: Diagnosis not present

## 2017-06-19 ENCOUNTER — Ambulatory Visit
Admission: RE | Admit: 2017-06-19 | Discharge: 2017-06-19 | Disposition: A | Discharge status: Home / Self Care | Payer: BC Managed Care – PPO | Source: Ambulatory Visit | Attending: Radiation Oncology | Admitting: Radiation Oncology

## 2017-06-19 DIAGNOSIS — C61 Malignant neoplasm of prostate: Secondary | ICD-10-CM | POA: Diagnosis not present

## 2017-06-22 ENCOUNTER — Ambulatory Visit: Payer: BC Managed Care – PPO

## 2017-06-23 ENCOUNTER — Ambulatory Visit
Admission: RE | Admit: 2017-06-23 | Discharge: 2017-06-23 | Disposition: A | Discharge status: Home / Self Care | Payer: BC Managed Care – PPO | Source: Ambulatory Visit | Attending: Radiation Oncology | Admitting: Radiation Oncology

## 2017-06-23 DIAGNOSIS — C61 Malignant neoplasm of prostate: Secondary | ICD-10-CM | POA: Diagnosis not present

## 2017-06-24 ENCOUNTER — Ambulatory Visit
Admission: RE | Admit: 2017-06-24 | Discharge: 2017-06-24 | Disposition: A | Discharge status: Home / Self Care | Payer: BC Managed Care – PPO | Source: Ambulatory Visit | Attending: Radiation Oncology | Admitting: Radiation Oncology

## 2017-06-24 DIAGNOSIS — C61 Malignant neoplasm of prostate: Secondary | ICD-10-CM | POA: Diagnosis not present

## 2017-06-25 ENCOUNTER — Ambulatory Visit
Admission: RE | Admit: 2017-06-25 | Discharge: 2017-06-25 | Disposition: A | Discharge status: Home / Self Care | Payer: BC Managed Care – PPO | Source: Ambulatory Visit | Attending: Radiation Oncology | Admitting: Radiation Oncology

## 2017-06-25 DIAGNOSIS — C61 Malignant neoplasm of prostate: Secondary | ICD-10-CM | POA: Diagnosis not present

## 2017-06-26 ENCOUNTER — Telehealth: Payer: Self-pay | Admitting: Radiation Oncology

## 2017-06-26 ENCOUNTER — Ambulatory Visit
Admission: RE | Admit: 2017-06-26 | Discharge: 2017-06-26 | Disposition: A | Discharge status: Home / Self Care | Payer: BC Managed Care – PPO | Source: Ambulatory Visit | Attending: Radiation Oncology | Admitting: Radiation Oncology

## 2017-06-26 DIAGNOSIS — C61 Malignant neoplasm of prostate: Secondary | ICD-10-CM | POA: Diagnosis not present

## 2017-06-26 NOTE — Telephone Encounter (Signed)
Patient came to Scheduling Dept and stated that he had just came from having Radiation Tx and that his Radiation Tx scheduled for Monday 06/29/17 was rescheduled from 3:30 pm to 1:45 pm and that his Radiation Lab appointment was left at 3:15 pm and  asked if I can reschedule his Lab appointment. Patient stated that he was not under care by an Oncologist here @ [facility] and only sees Dr Baruch Gouty for Radiation. I saw that the lab was scheduled by [facility] in Rad/Onc, so I called their ext to see if it was ok for me to adjust his labs 30 mins prior to his Radiation appointment. I got no answer, so I called John and explained to him and asked to be transferred to [facility], which I was. After, explaining to her what the patient wanted, her response was, "Well, I don't know what he transferred you to me for ?"  I replied, " The patient came upstairs and asked to have his lab appt moved up to accommodate the radiation appt time and I saw that it was scheduled by her, and before rescheduling his lab appt, I needed to make sure that it was ok to do so and that his labs wasn't required to be drawn within a specific timeframe. I rescheduled the patient 30 mins prior to his Radiation appt and gave him a copy of the updated schedule.

## 2017-06-29 ENCOUNTER — Inpatient Hospital Stay: Payer: BC Managed Care – PPO

## 2017-06-29 ENCOUNTER — Ambulatory Visit
Admission: RE | Admit: 2017-06-29 | Discharge: 2017-06-29 | Disposition: A | Discharge status: Home / Self Care | Payer: BC Managed Care – PPO | Source: Ambulatory Visit | Attending: Radiation Oncology | Admitting: Radiation Oncology

## 2017-06-29 DIAGNOSIS — C61 Malignant neoplasm of prostate: Secondary | ICD-10-CM | POA: Diagnosis not present

## 2017-06-29 LAB — CBC
HCT: 40.9 % (ref 40.0–52.0)
Hemoglobin: 13.7 g/dL (ref 13.0–18.0)
MCH: 30.9 pg (ref 26.0–34.0)
MCHC: 33.6 g/dL (ref 32.0–36.0)
MCV: 91.9 fL (ref 80.0–100.0)
Platelets: 201 10*3/uL (ref 150–440)
RBC: 4.45 MIL/uL (ref 4.40–5.90)
RDW: 14.4 % (ref 11.5–14.5)
WBC: 4.4 10*3/uL (ref 3.8–10.6)

## 2017-06-30 ENCOUNTER — Ambulatory Visit
Admission: RE | Admit: 2017-06-30 | Discharge: 2017-06-30 | Disposition: A | Discharge status: Home / Self Care | Payer: BC Managed Care – PPO | Source: Ambulatory Visit | Attending: Radiation Oncology | Admitting: Radiation Oncology

## 2017-06-30 DIAGNOSIS — C61 Malignant neoplasm of prostate: Secondary | ICD-10-CM | POA: Diagnosis not present

## 2017-07-01 ENCOUNTER — Ambulatory Visit
Admission: RE | Admit: 2017-07-01 | Discharge: 2017-07-01 | Disposition: A | Discharge status: Home / Self Care | Payer: BC Managed Care – PPO | Source: Ambulatory Visit | Attending: Radiation Oncology | Admitting: Radiation Oncology

## 2017-07-01 DIAGNOSIS — C61 Malignant neoplasm of prostate: Secondary | ICD-10-CM | POA: Diagnosis not present

## 2017-07-02 ENCOUNTER — Ambulatory Visit
Admission: RE | Admit: 2017-07-02 | Discharge: 2017-07-02 | Disposition: A | Discharge status: Home / Self Care | Payer: BC Managed Care – PPO | Source: Ambulatory Visit | Attending: Radiation Oncology | Admitting: Radiation Oncology

## 2017-07-02 DIAGNOSIS — C61 Malignant neoplasm of prostate: Secondary | ICD-10-CM | POA: Diagnosis not present

## 2017-07-03 ENCOUNTER — Ambulatory Visit
Admission: RE | Admit: 2017-07-03 | Discharge: 2017-07-03 | Disposition: A | Discharge status: Home / Self Care | Payer: BC Managed Care – PPO | Source: Ambulatory Visit | Attending: Radiation Oncology | Admitting: Radiation Oncology

## 2017-07-03 DIAGNOSIS — C61 Malignant neoplasm of prostate: Secondary | ICD-10-CM | POA: Diagnosis not present

## 2017-07-06 ENCOUNTER — Ambulatory Visit
Admission: RE | Admit: 2017-07-06 | Discharge: 2017-07-06 | Disposition: A | Discharge status: Home / Self Care | Payer: BC Managed Care – PPO | Source: Ambulatory Visit | Attending: Radiation Oncology | Admitting: Radiation Oncology

## 2017-07-06 DIAGNOSIS — C61 Malignant neoplasm of prostate: Secondary | ICD-10-CM | POA: Diagnosis not present

## 2017-07-08 ENCOUNTER — Ambulatory Visit
Admission: RE | Admit: 2017-07-08 | Discharge: 2017-07-08 | Disposition: A | Discharge status: Home / Self Care | Payer: BC Managed Care – PPO | Source: Ambulatory Visit | Attending: Radiation Oncology | Admitting: Radiation Oncology

## 2017-07-08 DIAGNOSIS — C61 Malignant neoplasm of prostate: Secondary | ICD-10-CM | POA: Diagnosis not present

## 2017-07-09 ENCOUNTER — Ambulatory Visit
Admission: RE | Admit: 2017-07-09 | Discharge: 2017-07-09 | Disposition: A | Discharge status: Home / Self Care | Payer: BC Managed Care – PPO | Source: Ambulatory Visit | Attending: Radiation Oncology | Admitting: Radiation Oncology

## 2017-07-09 DIAGNOSIS — C61 Malignant neoplasm of prostate: Secondary | ICD-10-CM | POA: Diagnosis not present

## 2017-07-10 ENCOUNTER — Ambulatory Visit
Admission: RE | Admit: 2017-07-10 | Discharge: 2017-07-10 | Disposition: A | Discharge status: Home / Self Care | Payer: BC Managed Care – PPO | Source: Ambulatory Visit | Attending: Radiation Oncology | Admitting: Radiation Oncology

## 2017-07-10 DIAGNOSIS — C61 Malignant neoplasm of prostate: Secondary | ICD-10-CM | POA: Diagnosis not present

## 2017-07-13 ENCOUNTER — Ambulatory Visit
Admission: RE | Admit: 2017-07-13 | Discharge: 2017-07-13 | Disposition: A | Discharge status: Home / Self Care | Payer: BC Managed Care – PPO | Source: Ambulatory Visit | Attending: Radiation Oncology | Admitting: Radiation Oncology

## 2017-07-13 ENCOUNTER — Inpatient Hospital Stay: Payer: BC Managed Care – PPO

## 2017-07-13 DIAGNOSIS — C61 Malignant neoplasm of prostate: Secondary | ICD-10-CM | POA: Diagnosis not present

## 2017-07-13 LAB — CBC
HCT: 40.5 % (ref 40.0–52.0)
HEMOGLOBIN: 13.4 g/dL (ref 13.0–18.0)
MCH: 30.6 pg (ref 26.0–34.0)
MCHC: 33.1 g/dL (ref 32.0–36.0)
MCV: 92.5 fL (ref 80.0–100.0)
Platelets: 217 10*3/uL (ref 150–440)
RBC: 4.38 MIL/uL — AB (ref 4.40–5.90)
RDW: 14 % (ref 11.5–14.5)
WBC: 4.8 10*3/uL (ref 3.8–10.6)

## 2017-07-15 ENCOUNTER — Ambulatory Visit
Admission: RE | Admit: 2017-07-15 | Discharge: 2017-07-15 | Disposition: A | Discharge status: Home / Self Care | Payer: BC Managed Care – PPO | Source: Ambulatory Visit | Attending: Radiation Oncology | Admitting: Radiation Oncology

## 2017-07-15 DIAGNOSIS — C61 Malignant neoplasm of prostate: Secondary | ICD-10-CM | POA: Diagnosis not present

## 2017-07-16 ENCOUNTER — Ambulatory Visit
Admission: RE | Admit: 2017-07-16 | Discharge: 2017-07-16 | Disposition: A | Discharge status: Home / Self Care | Payer: BC Managed Care – PPO | Source: Ambulatory Visit | Attending: Radiation Oncology | Admitting: Radiation Oncology

## 2017-07-16 DIAGNOSIS — C61 Malignant neoplasm of prostate: Secondary | ICD-10-CM | POA: Diagnosis not present

## 2017-07-17 ENCOUNTER — Ambulatory Visit
Admission: RE | Admit: 2017-07-17 | Discharge: 2017-07-17 | Disposition: A | Discharge status: Home / Self Care | Payer: BC Managed Care – PPO | Source: Ambulatory Visit | Attending: Radiation Oncology | Admitting: Radiation Oncology

## 2017-07-17 DIAGNOSIS — C61 Malignant neoplasm of prostate: Secondary | ICD-10-CM | POA: Diagnosis not present

## 2017-07-20 ENCOUNTER — Ambulatory Visit
Admission: RE | Admit: 2017-07-20 | Payer: BC Managed Care – PPO | Source: Ambulatory Visit | Admitting: Unknown Physician Specialty

## 2017-07-20 ENCOUNTER — Ambulatory Visit
Admission: RE | Admit: 2017-07-20 | Discharge: 2017-07-20 | Disposition: A | Discharge status: Home / Self Care | Payer: BC Managed Care – PPO | Source: Ambulatory Visit | Attending: Radiation Oncology | Admitting: Radiation Oncology

## 2017-07-20 ENCOUNTER — Encounter: Admission: RE | Payer: Self-pay | Source: Ambulatory Visit

## 2017-07-20 DIAGNOSIS — C61 Malignant neoplasm of prostate: Secondary | ICD-10-CM | POA: Diagnosis not present

## 2017-07-20 SURGERY — COLONOSCOPY WITH PROPOFOL
Anesthesia: General

## 2017-07-21 ENCOUNTER — Ambulatory Visit
Admission: RE | Admit: 2017-07-21 | Discharge: 2017-07-21 | Disposition: A | Discharge status: Home / Self Care | Payer: BC Managed Care – PPO | Source: Ambulatory Visit | Attending: Radiation Oncology | Admitting: Radiation Oncology

## 2017-07-21 DIAGNOSIS — C61 Malignant neoplasm of prostate: Secondary | ICD-10-CM | POA: Diagnosis not present

## 2017-07-22 ENCOUNTER — Ambulatory Visit
Admission: RE | Admit: 2017-07-22 | Discharge: 2017-07-22 | Disposition: A | Discharge status: Home / Self Care | Payer: BC Managed Care – PPO | Source: Ambulatory Visit | Attending: Radiation Oncology | Admitting: Radiation Oncology

## 2017-07-22 DIAGNOSIS — C61 Malignant neoplasm of prostate: Secondary | ICD-10-CM | POA: Diagnosis not present

## 2017-07-23 ENCOUNTER — Ambulatory Visit
Admission: RE | Admit: 2017-07-23 | Discharge: 2017-07-23 | Disposition: A | Discharge status: Home / Self Care | Payer: BC Managed Care – PPO | Source: Ambulatory Visit | Attending: Radiation Oncology | Admitting: Radiation Oncology

## 2017-07-23 DIAGNOSIS — C61 Malignant neoplasm of prostate: Secondary | ICD-10-CM | POA: Diagnosis not present

## 2017-07-24 ENCOUNTER — Ambulatory Visit
Admission: RE | Admit: 2017-07-24 | Discharge: 2017-07-24 | Disposition: A | Discharge status: Home / Self Care | Payer: BC Managed Care – PPO | Source: Ambulatory Visit | Attending: Radiation Oncology | Admitting: Radiation Oncology

## 2017-07-24 DIAGNOSIS — C61 Malignant neoplasm of prostate: Secondary | ICD-10-CM | POA: Diagnosis not present

## 2017-07-27 ENCOUNTER — Ambulatory Visit
Admission: RE | Admit: 2017-07-27 | Discharge: 2017-07-27 | Disposition: A | Discharge status: Home / Self Care | Payer: BC Managed Care – PPO | Source: Ambulatory Visit | Attending: Radiation Oncology | Admitting: Radiation Oncology

## 2017-07-27 DIAGNOSIS — C61 Malignant neoplasm of prostate: Secondary | ICD-10-CM | POA: Diagnosis not present

## 2017-07-28 ENCOUNTER — Ambulatory Visit: Payer: BC Managed Care – PPO

## 2017-07-28 ENCOUNTER — Ambulatory Visit
Admission: RE | Admit: 2017-07-28 | Discharge: 2017-07-28 | Disposition: A | Discharge status: Home / Self Care | Payer: BC Managed Care – PPO | Source: Ambulatory Visit | Attending: Radiation Oncology | Admitting: Radiation Oncology

## 2017-07-28 DIAGNOSIS — C61 Malignant neoplasm of prostate: Secondary | ICD-10-CM | POA: Diagnosis not present

## 2017-07-29 ENCOUNTER — Ambulatory Visit
Admission: RE | Admit: 2017-07-29 | Discharge: 2017-07-29 | Disposition: A | Discharge status: Home / Self Care | Payer: BC Managed Care – PPO | Source: Ambulatory Visit | Attending: Radiation Oncology | Admitting: Radiation Oncology

## 2017-07-29 ENCOUNTER — Other Ambulatory Visit: Payer: Self-pay

## 2017-07-29 ENCOUNTER — Other Ambulatory Visit: Payer: BC Managed Care – PPO

## 2017-07-29 DIAGNOSIS — C61 Malignant neoplasm of prostate: Secondary | ICD-10-CM

## 2017-07-30 ENCOUNTER — Telehealth: Payer: Self-pay

## 2017-07-30 LAB — PSA

## 2017-07-30 NOTE — Telephone Encounter (Signed)
-----   Message from Hollice Espy, MD sent at 07/30/2017  8:20 AM EST ----- Your PSA remains undetectable.  This is awesome news.   Hollice Espy, MD

## 2017-07-30 NOTE — Telephone Encounter (Signed)
Letter sent.

## 2017-08-30 ENCOUNTER — Encounter: Payer: Self-pay | Admitting: *Deleted

## 2017-08-30 ENCOUNTER — Ambulatory Visit
Admission: EM | Admit: 2017-08-30 | Discharge: 2017-08-30 | Disposition: A | Discharge status: Home / Self Care | Payer: BC Managed Care – PPO | Attending: Family Medicine | Admitting: Family Medicine

## 2017-08-30 ENCOUNTER — Other Ambulatory Visit: Payer: Self-pay

## 2017-08-30 DIAGNOSIS — R05 Cough: Secondary | ICD-10-CM | POA: Diagnosis not present

## 2017-08-30 DIAGNOSIS — J029 Acute pharyngitis, unspecified: Secondary | ICD-10-CM | POA: Diagnosis not present

## 2017-08-30 DIAGNOSIS — J111 Influenza due to unidentified influenza virus with other respiratory manifestations: Secondary | ICD-10-CM

## 2017-08-30 MED ORDER — OSELTAMIVIR PHOSPHATE 75 MG PO CAPS: 75.0000 mg | ORAL_CAPSULE | Freq: Two times a day (BID) | ORAL | 0 refills | Status: DC

## 2017-08-30 NOTE — Discharge Instructions (Signed)
Rest, fluids, tylenol/motrin as needed.  Tamiflu as prescribed.  Take care  Dr. Lacinda Axon

## 2017-08-30 NOTE — ED Triage Notes (Signed)
Patient started having symptoms of nasal congestion, cough, fever and body aches 2 days ago.

## 2017-08-30 NOTE — ED Provider Notes (Signed)
[facility]    CSN: [id number] Arrival date & time: 08/30/17  1313  History   Chief Complaint Chief Complaint  Patient presents with  . Nasal Congestion  . Fever   HPI  62 year old male presents with influenza-like symptoms.  Patient reports he has been sick since Friday evening.  He has had cough, congestion, sore throat, headache, fever, chills, body aches.  Fever, T-max 101.5.  No reported sick contacts.  No known exacerbating relieving factors.  Patient states that he feels very poorly.  Symptoms are severe.  No other associated symptoms.  No other complaints at this time.  Past Medical History:  Diagnosis Date  . Arthritis   . Complication of anesthesia    severe n/v  . Diabetes mellitus without complication ([place])   . GERD (gastroesophageal reflux disease)   . Hyperlipidemia   . PONV (postoperative nausea and vomiting)   . SAH (subarachnoid hemorrhage) ([place])    spontaneous 2000 n surgery  . Vertigo    since childhood if on back long will get vertigo     Patient Active Problem List   Diagnosis Date Noted  . Prostate cancer ([place]) 11/30/2016  . Elevated PSA 10/13/2016    Past Surgical History:  Procedure Laterality Date  . MENISCUS REPAIR     x 3  . ROBOT ASSISTED LAPAROSCOPIC RADICAL PROSTATECTOMY N/A 12/29/2016   Procedure: ROBOTIC ASSISTED LAPAROSCOPIC RADICAL PROSTATECTOMY;  Surgeon: Hollice Espy, MD;  Location: [facility] ORS;  Service: Urology;  Laterality: N/A;   Home Medications    Prior to Admission medications   Medication Sig Start Date End Date Taking? Authorizing Provider  Insulin Degludec (TRESIBA FLEXTOUCH) 200 UNIT/ML SOPN Inject 16 Units into the skin every evening. 05/11/17  Yes [provider]  metFORMIN (GLUCOPHAGE-XR) 500 MG 24 hr tablet Take 1,000 mg by mouth every evening.  01/16/16  Yes [provider]  naproxen sodium (ANAPROX) 220 MG tablet Take 220-440 mg by mouth See admin instructions. 2 tablets every morning  and 1 tablet every evening   Yes [provider]  omeprazole (PRILOSEC) 20 MG capsule Take 20 mg by mouth daily.  09/17/16  Yes [provider]  pioglitazone (ACTOS) 30 MG tablet Take 30 mg by mouth every evening.  01/16/16  Yes [provider]  simvastatin (ZOCOR) 40 MG tablet Take 40 mg by mouth every evening.  09/17/16  Yes [provider]  oseltamivir (TAMIFLU) 75 MG capsule Take 1 capsule (75 mg total) by mouth every 12 (twelve) hours. 08/30/17   Coral Spikes, DO  sildenafil (REVATIO) 20 MG tablet Take 1 tablet (20 mg total) by mouth as needed. Take 1-5 tabs as needed prior to intercourse 01/28/17   Hollice Espy, MD   Family History Family History  Problem Relation Age of Onset  . Prostate cancer Father   . Stroke Father   . Stroke Mother   . Bladder Cancer Neg Hx   . Kidney cancer Neg Hx     Social History Social History   Tobacco Use  . Smoking status: Never Smoker  . Smokeless tobacco: Former Systems developer    Types: Chew  Substance Use Topics  . Alcohol use: Yes    Comment: occas  . Drug use: No     Allergies   Patient has no known allergies.   Review of Systems Review of Systems  Constitutional: Positive for chills and fever.  HENT: Positive for congestion and sore throat.   Respiratory: Positive for cough.  Musculoskeletal:       Body aches.   Physical Exam Triage Vital Signs ED Triage Vitals  Enc Vitals Group     BP 08/30/17 1323 127/80     Pulse Rate 08/30/17 1323 79     Resp 08/30/17 1323 16     Temp 08/30/17 1323 100.1 F (37.8 C)     Temp Source 08/30/17 1323 Oral     SpO2 08/30/17 1323 95 %     Weight 08/30/17 1326 203 lb (92.1 kg)     Height 08/30/17 1326 5\' 6"  (1.676 m)     Head Circumference --      Peak Flow --      Pain Score 08/30/17 1325 0     Pain Loc --      Pain Edu? --      Excl. in [place]? --    Updated Vital Signs BP 127/80 (BP Location: Left Arm)   Pulse 79   Temp 100.1 F (37.8 C) (Oral)   Resp 16    Ht 5\' 6"  (1.676 m)   Wt 203 lb (92.1 kg)   SpO2 95%   BMI 32.77 kg/m     Physical Exam  Constitutional: He is oriented to person, place, and time. He appears well-developed and well-nourished. No distress.  HENT:  Head: Normocephalic and atraumatic.  Mouth/Throat: Oropharynx is clear and moist.  Eyes: Conjunctivae are normal. Right eye exhibits no discharge. Left eye exhibits no discharge.  Neck: Neck supple.  Cardiovascular: Normal rate and regular rhythm.  Pulmonary/Chest: Effort normal and breath sounds normal. He has no wheezes. He has no rales.  Lymphadenopathy:    He has no cervical adenopathy.  Neurological: He is alert and oriented to person, place, and time.  Skin: Skin is warm. No rash noted.  Psychiatric: He has a normal mood and affect. His behavior is normal.  Nursing note and vitals reviewed.  UC Treatments / Results  Labs (all labs ordered are listed, but only abnormal results are displayed) Labs Reviewed - No data to display  EKG  EKG Interpretation None       Radiology No results found.  Procedures Procedures (including critical care time)  Medications Ordered in UC Medications - No data to display   Initial Impression / Assessment and Plan / UC Course  I have reviewed the triage vital signs and the nursing notes.  Pertinent labs & imaging results that were available during my care of the patient were reviewed by me and considered in my medical decision making (see chart for details).     62 year old male presents with suspected influenza.  Treating with Tamiflu.  Work note given.  Final Clinical Impressions(s) / UC Diagnoses   Final diagnoses:  Influenza    ED Discharge Orders        Ordered    oseltamivir (TAMIFLU) 75 MG capsule  Every 12 hours     08/30/17 1349     Controlled Substance Prescriptions  Controlled Substance Registry consulted? Not Applicable   Coral Spikes, DO 08/30/17 1356

## 2017-09-02 ENCOUNTER — Ambulatory Visit: Payer: BC Managed Care – PPO | Admitting: Radiation Oncology

## 2017-09-07 ENCOUNTER — Ambulatory Visit
Admission: RE | Admit: 2017-09-07 | Discharge: 2017-09-07 | Disposition: A | Discharge status: Home / Self Care | Payer: BC Managed Care – PPO | Source: Ambulatory Visit | Attending: Radiation Oncology | Admitting: Radiation Oncology

## 2017-09-07 ENCOUNTER — Other Ambulatory Visit: Payer: Self-pay

## 2017-09-07 ENCOUNTER — Encounter: Payer: Self-pay | Admitting: Radiation Oncology

## 2017-09-07 VITALS — BP 127/70 | HR 68 | Temp 97.6°F | Resp 18 | Wt 199.7 lb

## 2017-09-07 DIAGNOSIS — C61 Malignant neoplasm of prostate: Secondary | ICD-10-CM | POA: Insufficient documentation

## 2017-09-07 DIAGNOSIS — Z923 Personal history of irradiation: Secondary | ICD-10-CM | POA: Insufficient documentation

## 2017-09-07 NOTE — Progress Notes (Signed)
Radiation Oncology Follow up Note  Name: Keith Holland   Date:   09/07/2017 MRN:  [id number] DOB: [birth year]    This 62 y.o. male presents to the clinic today for one-month follow-up status post salvage radiation therapy for Gleason 7 adenocarcinoma the prostate.  REFERRING PROVIDER: Ezequiel Kayser, MD  HPI: Patient is a 62 year old male now out 1 month having completed salvage radiation therapy to his prostatic fossa for Gleason 7 (3+4) adenocarcinoma with focal microscopic invasion of the bladder neck.Marland Kitchen He is seen today in routine follow-up is doing well. Still has some urinary incontinence although it is improving. He had a PSA back in October and urologist office which was less than 0.1. He is having no problems with diarrhea.  COMPLICATIONS OF TREATMENT: none  FOLLOW UP COMPLIANCE: keeps appointments   PHYSICAL EXAM:  BP 127/70   Pulse 68   Temp 97.6 F (36.4 C)   Resp 18   Wt 199 lb 11.8 oz (90.6 kg)   BMI 32.24 kg/m  Well-developed well-nourished patient in NAD. HEENT reveals PERLA, EOMI, discs not visualized.  Oral cavity is clear. No oral mucosal lesions are identified. Neck is clear without evidence of cervical or supraclavicular adenopathy. Lungs are clear to A&P. Cardiac examination is essentially unremarkable with regular rate and rhythm without murmur rub or thrill. Abdomen is benign with no organomegaly or masses noted. Motor sensory and DTR levels are equal and symmetric in the upper and lower extremities. Cranial nerves II through XII are grossly intact. Proprioception is intact. No peripheral adenopathy or edema is identified. No motor or sensory levels are noted. Crude visual fields are within normal range.  RADIOLOGY RESULTS: No current films for review  PLAN: Present time patient is doing well 1 month out from salvage radiation therapy. Very low side effect profile. I'm please was overall progress. I like to see a PSA in about 3-4 months and I've scheduled follow-up  at that time. Patient knows to call sooner with any concerns.  I would like to take this opportunity to thank you for allowing me to participate in the care of your patient.Noreene Filbert, MD

## 2017-10-20 ENCOUNTER — Other Ambulatory Visit: Payer: Self-pay

## 2017-10-20 DIAGNOSIS — C61 Malignant neoplasm of prostate: Secondary | ICD-10-CM

## 2017-10-22 ENCOUNTER — Other Ambulatory Visit: Payer: BC Managed Care – PPO

## 2017-10-22 DIAGNOSIS — C61 Malignant neoplasm of prostate: Secondary | ICD-10-CM

## 2017-10-23 LAB — PSA: Prostate Specific Ag, Serum: <0.1 ng/mL (ref 0.0–4.0)

## 2017-10-27 ENCOUNTER — Encounter: Payer: Self-pay | Admitting: Urology

## 2017-10-27 ENCOUNTER — Ambulatory Visit: Payer: BC Managed Care – PPO | Admitting: Urology

## 2017-10-27 VITALS — BP 153/77 | HR 59 | Ht 66.0 in | Wt 198.0 lb

## 2017-10-27 DIAGNOSIS — N5231 Erectile dysfunction following radical prostatectomy: Secondary | ICD-10-CM | POA: Diagnosis not present

## 2017-10-27 DIAGNOSIS — C61 Malignant neoplasm of prostate: Secondary | ICD-10-CM

## 2017-10-27 DIAGNOSIS — N393 Stress incontinence (female) (male): Secondary | ICD-10-CM

## 2017-10-27 NOTE — Progress Notes (Signed)
10/27/2017 3:17 PM   Keith Holland Nov 02, 1955 [id number]  Referring provider: Ezequiel Kayser, MD [place] [facility] [place], [place] [address]  Chief Complaint  Patient presents with  . Prostate Cancer    81month    HPI: 62 year old male who s/p radical robotic prostatectomy 12/2016 who returns today for routine follow up.    He was initially diagnosed with Gleason 3+3 prostate cancer, PSA 9.5 disease. He elected to proceed with radical robotic prostatectomy which was performed on 12/29/2016.  Lymphadenectomy was not performed based on risk stratification.   Surgical pathology was consistent with Gleason 3+4, pT3aNxMx with focal microscopic invasion of the bladder neck with a unifocal 1 mm positive margin at this level. + extraprostatic extension at the bladder neck,+ bladder neck margin (right), - LVI, +PNI.  He underwent adjuvant radiation in 70/2637 without complication.  This was well-tolerated.  He reports that after the salvage radiation, he backed up with his continence.  Over the past month to 2 months, he doe leaks extremely little and wears only safety pad per day.  He is very pleased with this result.  He also reports that he started having partial penile fullness augmented by sildenafil.  He mentions today that he may be interested in pursuing injection teaching.  PSA as of 10/22/17 continues to remain undetectable, <0.1  PMH: Past Medical History:  Diagnosis Date  . Arthritis   . Complication of anesthesia    severe n/v  . Diabetes mellitus without complication ([place])   . GERD (gastroesophageal reflux disease)   . Hyperlipidemia   . PONV (postoperative nausea and vomiting)   . SAH (subarachnoid hemorrhage) ([place])    spontaneous 2000 n surgery  . Vertigo    since childhood if on back long will get vertigo     Surgical History: Past Surgical History:  Procedure Laterality Date  . MENISCUS REPAIR     x 3  . ROBOT ASSISTED LAPAROSCOPIC  RADICAL PROSTATECTOMY N/A 12/29/2016   Procedure: ROBOTIC ASSISTED LAPAROSCOPIC RADICAL PROSTATECTOMY;  Surgeon: Hollice Espy, MD;  Location: [facility] ORS;  Service: Urology;  Laterality: N/A;    Home Medications:  Allergies as of 10/27/2017   No Known Allergies     Medication List        Accurate as of 10/27/17  3:17 PM. Always use your most recent med list.          metFORMIN 500 MG 24 hr tablet Commonly known as:  GLUCOPHAGE-XR Take 1,000 mg by mouth every evening.   naproxen sodium 220 MG tablet Commonly known as:  ALEVE Take 220-440 mg by mouth See admin instructions. 2 tablets every morning and 1 tablet every evening   omeprazole 20 MG capsule Commonly known as:  PRILOSEC Take 20 mg by mouth daily.   oseltamivir 75 MG capsule Commonly known as:  TAMIFLU Take 1 capsule (75 mg total) by mouth every 12 (twelve) hours.   pioglitazone 30 MG tablet Commonly known as:  ACTOS Take 30 mg by mouth every evening.   sildenafil 20 MG tablet Commonly known as:  REVATIO Take 1 tablet (20 mg total) by mouth as needed. Take 1-5 tabs as needed prior to intercourse   simvastatin 40 MG tablet Commonly known as:  ZOCOR Take 40 mg by mouth every evening.   TRESIBA FLEXTOUCH 200 UNIT/ML Sopn Generic drug:  Insulin Degludec Inject 16 Units into the skin every evening.       Allergies: No Known Allergies  Family  History: Family History  Problem Relation Age of Onset  . Prostate cancer Father   . Stroke Father   . Stroke Mother   . Bladder Cancer Neg Hx   . Kidney cancer Neg Hx     Social History:  reports that he has never smoked. He has quit using smokeless tobacco. His smokeless tobacco use included chew. He reports that he drinks alcohol. He reports that he does not use drugs.  ROS: UROLOGY Frequent Urination?: No Hard to postpone urination?: No Burning/pain with urination?: No Get up at night to urinate?: Yes Leakage of urine?: Yes Urine stream starts and stops?:  No Trouble starting stream?: No Do you have to strain to urinate?: No Blood in urine?: No Urinary tract infection?: No Sexually transmitted disease?: No Injury to kidneys or bladder?: No Painful intercourse?: No Weak stream?: No Erection problems?: Yes Penile pain?: No  Gastrointestinal Nausea?: No Vomiting?: No Indigestion/heartburn?: No Diarrhea?: No Constipation?: No  Constitutional Fever: No Night sweats?: No Weight loss?: No Fatigue?: No  Skin Skin rash/lesions?: No Itching?: No  Eyes Blurred vision?: No Double vision?: No  Ears/Nose/Throat Sore throat?: No Sinus problems?: No  Hematologic/Lymphatic Swollen glands?: No Easy bruising?: No  Cardiovascular Leg swelling?: No Chest pain?: No  Respiratory Cough?: No Shortness of breath?: No  Endocrine Excessive thirst?: No  Musculoskeletal Back pain?: Yes Joint pain?: Yes  Neurological Headaches?: No Dizziness?: No  Psychologic Depression?: No Anxiety?: No  Physical Exam: BP (!) 153/77   Pulse (!) 59   Ht 5\' 6"  (1.676 m)   Wt 198 lb (89.8 kg)   BMI 31.96 kg/m   Constitutional:  Alert and oriented, No acute distress.  Accompanied by wife today. HEENT: [place] AT, moist mucus membranes.  Trachea midline, no masses. Cardiovascular: No clubbing, cyanosis, or edema. Respiratory: Normal respiratory effort, no increased work of breathing. Neurologic: Grossly intact, no focal deficits, moving all 4 extremities. Psychiatric: Normal mood and affect.  Laboratory Data: Lab Results  Component Value Date   WBC 4.8 07/13/2017   HGB 13.4 07/13/2017   HCT 40.5 07/13/2017   MCV 92.5 07/13/2017   PLT 217 07/13/2017    Lab Results  Component Value Date   CREATININE 1.00 01/12/2017    Urinalysis Results for orders placed or performed in visit on 10/22/17  PSA  Result Value Ref Range   Prostate Specific Ag, Serum <0.1 0.0 - 4.0 ng/mL    Pertinent Imaging: n/a  Assessment & Plan:    1.  Prostate cancer [facility])  s/p RALP + BPLND, Gleason 3+4 pT3a with + BN invasion (microscopic invasion), + ECE Status post adjuvant radiation 05/2017 PSA undetectable Will check every 6 month PSA (lab visit in 6 months, MD visit with labs in 1 year)  2. SUI (stress urinary incontinence), male Minimal Wear safety pad without bother  3. Erectile dysfunction after radical prostatectomy Some response with sildenafil Interested in intracavernosal injections today Discussed the procedure for this including test dose teaching here in the office Side effects of medication including fibrosis, failure, priapism, hematoma amongst others were discussed.  All questions answered.  Return in about 1 year (around 10/28/2018) for PSA (6 months lab only), and then MD visit iwth PSA in 1 year, infection teaching appt in next month.  Hollice Espy, MD  [facility] [address], [place], [place] [address] [phone]

## 2017-11-30 ENCOUNTER — Telehealth: Payer: Self-pay | Admitting: Radiology

## 2017-11-30 NOTE — Telephone Encounter (Signed)
Script called in to [facility] for 26ml test dose

## 2017-11-30 NOTE — Telephone Encounter (Signed)
Pt states a script for Trimix hasn't been received by [facility]. Please send script as pt has an appt with Dr Erlene Quan on 12/01/2017.

## 2017-12-01 ENCOUNTER — Ambulatory Visit: Payer: BC Managed Care – PPO | Admitting: Urology

## 2017-12-02 ENCOUNTER — Ambulatory Visit: Payer: BC Managed Care – PPO | Admitting: Urology

## 2017-12-02 ENCOUNTER — Encounter: Payer: Self-pay | Admitting: Urology

## 2017-12-02 VITALS — BP 152/79 | HR 69 | Resp 16 | Ht 66.0 in | Wt 196.8 lb

## 2017-12-02 DIAGNOSIS — N5231 Erectile dysfunction following radical prostatectomy: Secondary | ICD-10-CM | POA: Diagnosis not present

## 2017-12-02 NOTE — Progress Notes (Signed)
12/02/17  Patient presents today for instructions on Trimix injections and to demonstrate his proficiency with the injections.   The patient washed his hands.  He then was instructed to tap the syringe to allow air bubbles to float to the top of the syringe. He then depressed the plunger to allow the air to escape.   He then selected an injection site at the left lateral shaft his penis between 9 and 11:00 positions between the base and midportion of the penis. He was careful to avoid the 6 and 12:00 positions and any surface veins or arteries.   He then grabbed the head of the penis towards the left with light tension.  He then cleansed the area using an alcohol swab. He then took the syringe and injected at a 90 angle into the corpus cavernosum 0.2 mL of Trimix.  He then applied compression to the injection site.   He tolerated the procedure well. He stated he had good comfort level with the process of the injections.     With this injection, he achieved 50% fully tumescent erection.    After about 10 minutes, the procedure was repeated with an additional 0.1 mL Trimix resulting in approximately  80 % erection.     A/P:   1.  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  I advised him to go home and titrate the dose up to 0.4 mL on his next injection it is serially increase the dose up to 0.5 mL.   He will return by 3:00 today or 2-3 hours after his injection if his erection fails to resolve.  We just discussed the complications of priapism and consequences of this including corporal fibrosis, penile pain, and worsening erectile dysfunction.   Hollice Espy, MD   I spent 15 min with this patient of which greater than 50% was spent in counseling and coordination of care with the patient.

## 2017-12-03 NOTE — Progress Notes (Signed)
Script called into [place]

## 2017-12-25 ENCOUNTER — Other Ambulatory Visit: Payer: Self-pay | Admitting: Orthopedic Surgery

## 2017-12-25 DIAGNOSIS — M175 Other unilateral secondary osteoarthritis of knee: Secondary | ICD-10-CM

## 2018-01-06 ENCOUNTER — Ambulatory Visit
Admission: RE | Admit: 2018-01-06 | Discharge: 2018-01-06 | Disposition: A | Discharge status: Home / Self Care | Payer: BC Managed Care – PPO | Source: Ambulatory Visit | Attending: Orthopedic Surgery | Admitting: Orthopedic Surgery

## 2018-01-06 DIAGNOSIS — S83232A Complex tear of medial meniscus, current injury, left knee, initial encounter: Secondary | ICD-10-CM | POA: Diagnosis not present

## 2018-01-06 DIAGNOSIS — M175 Other unilateral secondary osteoarthritis of knee: Secondary | ICD-10-CM | POA: Diagnosis not present

## 2018-01-06 DIAGNOSIS — X58XXXA Exposure to other specified factors, initial encounter: Secondary | ICD-10-CM | POA: Insufficient documentation

## 2018-01-07 ENCOUNTER — Ambulatory Visit
Admission: RE | Admit: 2018-01-07 | Discharge: 2018-01-07 | Disposition: A | Discharge status: Home / Self Care | Payer: BC Managed Care – PPO | Source: Ambulatory Visit | Attending: Radiation Oncology | Admitting: Radiation Oncology

## 2018-01-07 ENCOUNTER — Encounter: Payer: Self-pay | Admitting: Radiation Oncology

## 2018-01-07 ENCOUNTER — Other Ambulatory Visit: Payer: Self-pay

## 2018-01-07 VITALS — BP 115/67 | HR 61 | Resp 20 | Wt 198.0 lb

## 2018-01-07 DIAGNOSIS — Z08 Encounter for follow-up examination after completed treatment for malignant neoplasm: Secondary | ICD-10-CM | POA: Insufficient documentation

## 2018-01-07 DIAGNOSIS — C61 Malignant neoplasm of prostate: Secondary | ICD-10-CM | POA: Diagnosis present

## 2018-01-07 DIAGNOSIS — Z923 Personal history of irradiation: Secondary | ICD-10-CM | POA: Diagnosis not present

## 2018-01-07 NOTE — Progress Notes (Signed)
Radiation Oncology Follow up Note  Name: Keith Holland   Date:   01/07/2018 MRN:  [id number] DOB: 1956/02/05    This 62 y.o. male presents to the clinic today for 5 month follow-up status post salvage radiation therapy for Gleason 7 adenocarcinoma the prostate.  REFERRING PROVIDER: Ezequiel Kayser, MD  HPI: patient is a 62 year old male now out 5 months having completed salvage radiation therapy to his prostatic fossa for Gleason 7 (3+4) adenocarcinoma carcinoma with focal microscopic invasion the bladder neck. Seen today in routine follow-up he is doing well. Specifically denies diarrhea dysuria or any other GI/GU complaints. He continues on Lupron suppression by urology..his current PSA was less than 0.1  COMPLICATIONS OF TREATMENT: none  FOLLOW UP COMPLIANCE: keeps appointments   PHYSICAL EXAM:  BP 115/67   Pulse 61   Resp 20   Wt 197 lb 15.6 oz (89.8 kg)   BMI 31.95 kg/m  Well-developed well-nourished patient in NAD. HEENT reveals PERLA, EOMI, discs not visualized.  Oral cavity is clear. No oral mucosal lesions are identified. Neck is clear without evidence of cervical or supraclavicular adenopathy. Lungs are clear to A&P. Cardiac examination is essentially unremarkable with regular rate and rhythm without murmur rub or thrill. Abdomen is benign with no organomegaly or masses noted. Motor sensory and DTR levels are equal and symmetric in the upper and lower extremities. Cranial nerves II through XII are grossly intact. Proprioception is intact. No peripheral adenopathy or edema is identified. No motor or sensory levels are noted. Crude visual fields are within normal range.  RADIOLOGY RESULTS: films for review  PLAN: present time patient is doing well under excellent biochemical control of his prostate cancer. I've asked to see him back in 6 months for follow-up. He continues injuries deprivation therapy by urology.He continues to do well with low side effect profile. I've asked to see  him back in 6 months for follow-up and then will start once your follow-up visits. Patient knows to call with any concerns.  I would like to take this opportunity to thank you for allowing me to participate in the care of your patient.Noreene Filbert, MD

## 2018-01-20 ENCOUNTER — Encounter
Admission: RE | Admit: 2018-01-20 | Discharge: 2018-01-20 | Disposition: A | Discharge status: Home / Self Care | Payer: BC Managed Care – PPO | Source: Ambulatory Visit | Attending: Orthopedic Surgery | Admitting: Orthopedic Surgery

## 2018-01-20 ENCOUNTER — Other Ambulatory Visit: Payer: Self-pay

## 2018-01-20 DIAGNOSIS — E119 Type 2 diabetes mellitus without complications: Secondary | ICD-10-CM | POA: Diagnosis not present

## 2018-01-20 DIAGNOSIS — Z01812 Encounter for preprocedural laboratory examination: Secondary | ICD-10-CM | POA: Insufficient documentation

## 2018-01-20 DIAGNOSIS — Z01810 Encounter for preprocedural cardiovascular examination: Secondary | ICD-10-CM | POA: Insufficient documentation

## 2018-01-20 DIAGNOSIS — M1712 Unilateral primary osteoarthritis, left knee: Secondary | ICD-10-CM | POA: Insufficient documentation

## 2018-01-20 DIAGNOSIS — R001 Bradycardia, unspecified: Secondary | ICD-10-CM | POA: Diagnosis not present

## 2018-01-20 HISTORY — DX: Malignant (primary) neoplasm, unspecified: C80.1

## 2018-01-20 LAB — URINALYSIS, ROUTINE W REFLEX MICROSCOPIC
Bilirubin Urine: NEGATIVE
GLUCOSE, UA: NEGATIVE mg/dL
Hgb urine dipstick: NEGATIVE
Ketones, ur: NEGATIVE mg/dL
LEUKOCYTES UA: NEGATIVE
Nitrite: NEGATIVE
PROTEIN: NEGATIVE mg/dL
Specific Gravity, Urine: 1.005 (ref 1.005–1.030)
pH: 6 (ref 5.0–8.0)

## 2018-01-20 LAB — BASIC METABOLIC PANEL
ANION GAP: 7 (ref 5–15)
BUN: 18 mg/dL (ref 8–23)
CALCIUM: 8.9 mg/dL (ref 8.9–10.3)
CO2: 27 mmol/L (ref 22–32)
Chloride: 108 mmol/L (ref 98–111)
Creatinine, Ser: 0.78 mg/dL (ref 0.61–1.24)
GFR calc Af Amer: >60 mL/min (ref >60)
GFR calc non Af Amer: >60 mL/min (ref >60)
GLUCOSE: 105 mg/dL — AB (ref 70–99)
Potassium: 3.8 mmol/L (ref 3.5–5.1)
Sodium: 142 mmol/L (ref 135–145)

## 2018-01-20 LAB — SURGICAL PCR SCREEN
MRSA, PCR: NEGATIVE
Staphylococcus aureus: POSITIVE — AB

## 2018-01-20 LAB — CBC
HCT: 38.2 % — ABNORMAL LOW (ref 40.0–52.0)
Hemoglobin: 13.3 g/dL (ref 13.0–18.0)
MCH: 32.1 pg (ref 26.0–34.0)
MCHC: 34.7 g/dL (ref 32.0–36.0)
MCV: 92.5 fL (ref 80.0–100.0)
PLATELETS: 206 10*3/uL (ref 150–440)
RBC: 4.13 MIL/uL — ABNORMAL LOW (ref 4.40–5.90)
RDW: 13.6 % (ref 11.5–14.5)
WBC: 4.6 10*3/uL (ref 3.8–10.6)

## 2018-01-20 LAB — TYPE AND SCREEN
ABO/RH(D): O POS
ANTIBODY SCREEN: NEGATIVE

## 2018-01-20 LAB — PROTIME-INR
INR: 0.97
Prothrombin Time: 12.8 seconds (ref 11.4–15.2)

## 2018-01-20 LAB — SEDIMENTATION RATE: Sed Rate: 7 mm/hr (ref 0–20)

## 2018-01-20 LAB — APTT: aPTT: 28 seconds (ref 24–36)

## 2018-01-20 NOTE — Patient Instructions (Signed)
  Your procedure is scheduled on: Tuesday February 02, 2018 Report to Same Day Surgery 2nd floor medical mall ([place]-take elevator on left to 2nd floor.  Check in with surgery information desk.) To find out your arrival time please call [phone] between 1PM - 3PM on Monday February 01, 2018  Remember: Instructions that are not followed completely may result in serious medical risk, up to and including death, or upon the discretion of your surgeon and anesthesiologist your surgery may need to be rescheduled.    _x___ 1. Do not eat food, including mints, candies, chewing gum after midnight the night before your procedure. You may drink water up to 2 hours before you are scheduled to arrive at the hospital for your procedure.  Do not drink anything within 2 hours of your scheduled arrival to the hospital.      __x__ 2. No Alcohol for 24 hours before or after surgery.   __x__3. No Smoking or e-cigarettes for 24 prior to surgery.  Do not use any chewable tobacco products for at least 6 hour prior to surgery   ____  4. Bring all medications with you on the day of surgery if instructed.    __x__ 5. Notify your doctor if there is any change in your medical condition     (cold, fever, infections).   __x__6. On the morning of surgery brush your teeth with toothpaste and water.  You may rinse your mouth with mouth wash if you wish.  Do not swallow any toothpaste or mouthwash.   Do not wear jewelry.  Do not wear lotions, powders, deodorant, or perfumes.   Do not shave below your face/neck 48 hours prior to surgery.   Do not bring valuables to the hospital.    [facility] is not responsible for any belongings or valuables.               Contacts, dentures or bridgework may not be worn into surgery.  Leave your suitcase in the car. After surgery it may be brought to your room.  For patients admitted to the hospital, discharge time is determined by your treatment team.  Please read over  the following fact sheets that you were given:   [facility] Preparing for Surgery and or MRSA Information   _x___ Take anti-hypertensive listed below, cardiac, seizure, asthma, anti-reflux and psychiatric medicines. These include:  1. Omeprazole/Prilosec  _x___ Use CHG Soap or sage wipes as directed on instruction sheet   _x___  Stop Metformin 2 days prior to surgery, Sunday January 31, 2018.    _x___ Take 1/2 of usual insulin dose the night before surgery and none on the morning of surgery.   _x___ Follow recommendations from Cardiologist, Pulmonologist or PCP regarding stopping Aspirin, Coumadin, Plavix ,Eliquis, Effient, or Pradaxa, and Pletal.  _x___ Stop Anti-inflammatories 7 days prior to surgery (Tuesdasy January 26, 2018) such as Naproxen/Anaprox, Advil, Aleve, Ibuprofen, Motrin, Naprosyn, Goodies powders or aspirin products. OK to take Tylenol and Celebrex.   _x___ Stop supplements until after surgery.  But may continue Vitamin D, Vitamin B, and multivitamin.

## 2018-01-22 LAB — URINE CULTURE: Culture: NO GROWTH

## 2018-02-01 MED ORDER — CEFAZOLIN SODIUM-DEXTROSE 2-4 GM/100ML-% IV SOLN
2.0000 g | Freq: Once | INTRAVENOUS | Status: AC
  Administered 2018-02-02: 2 g via INTRAVENOUS

## 2018-02-01 MED ORDER — TRANEXAMIC ACID 1000 MG/10ML IV SOLN
1000.0000 mg | INTRAVENOUS | Status: AC
  Administered 2018-02-02: 1000 mg via INTRAVENOUS
  Filled 2018-02-01: qty 1100

## 2018-02-02 ENCOUNTER — Other Ambulatory Visit: Payer: Self-pay

## 2018-02-02 ENCOUNTER — Inpatient Hospital Stay: Payer: BC Managed Care – PPO

## 2018-02-02 ENCOUNTER — Inpatient Hospital Stay
Admission: RE | Admit: 2018-02-02 | Discharge: 2018-02-05 | DRG: 470 | Disposition: A | Discharge status: Home Health | Payer: BC Managed Care – PPO | Source: Ambulatory Visit | Attending: Orthopedic Surgery | Admitting: Orthopedic Surgery

## 2018-02-02 ENCOUNTER — Encounter
Admission: RE | Disposition: A | Discharge status: Home Health | Payer: Self-pay | Source: Ambulatory Visit | Attending: Orthopedic Surgery

## 2018-02-02 DIAGNOSIS — E119 Type 2 diabetes mellitus without complications: Secondary | ICD-10-CM | POA: Diagnosis present

## 2018-02-02 DIAGNOSIS — Z9079 Acquired absence of other genital organ(s): Secondary | ICD-10-CM | POA: Diagnosis not present

## 2018-02-02 DIAGNOSIS — Z6831 Body mass index (BMI) 31.0-31.9, adult: Secondary | ICD-10-CM

## 2018-02-02 DIAGNOSIS — M175 Other unilateral secondary osteoarthritis of knee: Secondary | ICD-10-CM | POA: Diagnosis present

## 2018-02-02 DIAGNOSIS — G8918 Other acute postprocedural pain: Secondary | ICD-10-CM

## 2018-02-02 DIAGNOSIS — R509 Fever, unspecified: Secondary | ICD-10-CM | POA: Diagnosis present

## 2018-02-02 DIAGNOSIS — Z8546 Personal history of malignant neoplasm of prostate: Secondary | ICD-10-CM

## 2018-02-02 DIAGNOSIS — R51 Headache: Secondary | ICD-10-CM | POA: Diagnosis present

## 2018-02-02 DIAGNOSIS — K219 Gastro-esophageal reflux disease without esophagitis: Secondary | ICD-10-CM | POA: Diagnosis present

## 2018-02-02 DIAGNOSIS — E785 Hyperlipidemia, unspecified: Secondary | ICD-10-CM | POA: Diagnosis present

## 2018-02-02 DIAGNOSIS — J309 Allergic rhinitis, unspecified: Secondary | ICD-10-CM | POA: Diagnosis present

## 2018-02-02 DIAGNOSIS — Z96652 Presence of left artificial knee joint: Secondary | ICD-10-CM

## 2018-02-02 HISTORY — PX: TOTAL KNEE ARTHROPLASTY: SHX125

## 2018-02-02 LAB — GLUCOSE, CAPILLARY
GLUCOSE-CAPILLARY: 212 mg/dL — AB (ref 70–99)
Glucose-Capillary: 137 mg/dL — ABNORMAL HIGH (ref 70–99)
Glucose-Capillary: 162 mg/dL — ABNORMAL HIGH (ref 70–99)
Glucose-Capillary: 164 mg/dL — ABNORMAL HIGH (ref 70–99)
Glucose-Capillary: 180 mg/dL — ABNORMAL HIGH (ref 70–99)

## 2018-02-02 SURGERY — ARTHROPLASTY, KNEE, TOTAL
Anesthesia: Spinal | Laterality: Left

## 2018-02-02 MED ORDER — LIDOCAINE HCL (PF) 2 % IJ SOLN
INTRAMUSCULAR | Status: AC
  Filled 2018-02-02: qty 10

## 2018-02-02 MED ORDER — MIDAZOLAM HCL 5 MG/5ML IJ SOLN
INTRAMUSCULAR | Status: DC | PRN
  Administered 2018-02-02: 2 mg via INTRAVENOUS

## 2018-02-02 MED ORDER — ZOLPIDEM TARTRATE 5 MG PO TABS: 5.0000 mg | ORAL_TABLET | Freq: Every evening | ORAL | Status: DC | PRN

## 2018-02-02 MED ORDER — ALUM & MAG HYDROXIDE-SIMETH 200-200-20 MG/5ML PO SUSP: 30.0000 mL | ORAL | Status: DC | PRN

## 2018-02-02 MED ORDER — SODIUM CHLORIDE 0.9 % IV SOLN
INTRAVENOUS | Status: DC | PRN
  Administered 2018-02-02: 60 mL

## 2018-02-02 MED ORDER — TRAMADOL HCL 50 MG PO TABS
50.0000 mg | ORAL_TABLET | Freq: Four times a day (QID) | ORAL | Status: DC
  Administered 2018-02-02 – 2018-02-05 (×12): 50 mg via ORAL
  Filled 2018-02-02 (×11): qty 1

## 2018-02-02 MED ORDER — PROPOFOL 10 MG/ML IV BOLUS
INTRAVENOUS | Status: DC | PRN
  Administered 2018-02-02: 30 mg via INTRAVENOUS
  Administered 2018-02-02: 20 mg via INTRAVENOUS
  Administered 2018-02-02: 30 mg via INTRAVENOUS

## 2018-02-02 MED ORDER — EPHEDRINE SULFATE 50 MG/ML IJ SOLN
INTRAMUSCULAR | Status: AC
Start: 2018-02-02 — End: ?
  Filled 2018-02-02: qty 1

## 2018-02-02 MED ORDER — PROPOFOL 500 MG/50ML IV EMUL
INTRAVENOUS | Status: AC
  Filled 2018-02-02: qty 50

## 2018-02-02 MED ORDER — BUPIVACAINE HCL (PF) 0.5 % IJ SOLN
INTRAMUSCULAR | Status: AC
  Filled 2018-02-02: qty 10

## 2018-02-02 MED ORDER — BUPIVACAINE-EPINEPHRINE (PF) 0.25% -1:200000 IJ SOLN
INTRAMUSCULAR | Status: AC
  Filled 2018-02-02: qty 30

## 2018-02-02 MED ORDER — BUPIVACAINE-EPINEPHRINE (PF) 0.25% -1:200000 IJ SOLN
INTRAMUSCULAR | Status: DC | PRN
  Administered 2018-02-02: 30 mL via PERINEURAL

## 2018-02-02 MED ORDER — METFORMIN HCL ER 500 MG PO TB24
1000.0000 mg | ORAL_TABLET | Freq: Every day | ORAL | Status: DC
  Administered 2018-02-02 – 2018-02-04 (×3): 1000 mg via ORAL
  Filled 2018-02-02 (×4): qty 2

## 2018-02-02 MED ORDER — FENTANYL CITRATE (PF) 100 MCG/2ML IJ SOLN
INTRAMUSCULAR | Status: DC | PRN
  Administered 2018-02-02 (×2): 25 ug via INTRAVENOUS
  Administered 2018-02-02: 50 ug via INTRAVENOUS

## 2018-02-02 MED ORDER — DOCUSATE SODIUM 100 MG PO CAPS
100.0000 mg | ORAL_CAPSULE | Freq: Two times a day (BID) | ORAL | Status: DC
  Administered 2018-02-02 – 2018-02-05 (×7): 100 mg via ORAL
  Filled 2018-02-02 (×7): qty 1

## 2018-02-02 MED ORDER — ACETAMINOPHEN 500 MG PO TABS
500.0000 mg | ORAL_TABLET | Freq: Four times a day (QID) | ORAL | Status: AC
  Administered 2018-02-02 – 2018-02-03 (×4): 500 mg via ORAL
  Filled 2018-02-02 (×4): qty 1

## 2018-02-02 MED ORDER — PHENOL 1.4 % MT LIQD
1.0000 | OROMUCOSAL | Status: DC | PRN
  Filled 2018-02-02: qty 177

## 2018-02-02 MED ORDER — SODIUM CHLORIDE 0.9 % IV SOLN
INTRAVENOUS | Status: DC
  Administered 2018-02-02 (×2): via INTRAVENOUS

## 2018-02-02 MED ORDER — PROPOFOL 500 MG/50ML IV EMUL
INTRAVENOUS | Status: DC | PRN
  Administered 2018-02-02: 100 ug/kg/min via INTRAVENOUS

## 2018-02-02 MED ORDER — CEFAZOLIN SODIUM-DEXTROSE 2-4 GM/100ML-% IV SOLN
2.0000 g | Freq: Four times a day (QID) | INTRAVENOUS | Status: AC
  Administered 2018-02-02 – 2018-02-03 (×3): 2 g via INTRAVENOUS
  Filled 2018-02-02 (×3): qty 100

## 2018-02-02 MED ORDER — ONDANSETRON HCL 4 MG PO TABS
4.0000 mg | ORAL_TABLET | Freq: Four times a day (QID) | ORAL | Status: DC | PRN
  Administered 2018-02-02: 4 mg via ORAL
  Filled 2018-02-02: qty 1

## 2018-02-02 MED ORDER — NEOMYCIN-POLYMYXIN B GU 40-200000 IR SOLN
Status: AC
  Filled 2018-02-02: qty 20

## 2018-02-02 MED ORDER — ONDANSETRON HCL 4 MG/2ML IJ SOLN: 4.0000 mg | Freq: Once | INTRAMUSCULAR | Status: DC | PRN

## 2018-02-02 MED ORDER — FENTANYL CITRATE (PF) 100 MCG/2ML IJ SOLN
INTRAMUSCULAR | Status: AC
  Filled 2018-02-02: qty 2

## 2018-02-02 MED ORDER — ACETAMINOPHEN 325 MG PO TABS
325.0000 mg | ORAL_TABLET | Freq: Four times a day (QID) | ORAL | Status: DC | PRN
  Administered 2018-02-04: 650 mg via ORAL
  Filled 2018-02-02: qty 2

## 2018-02-02 MED ORDER — MAGNESIUM CITRATE PO SOLN
1.0000 | Freq: Once | ORAL | Status: DC | PRN
Start: 2018-02-02 — End: 2018-02-05
  Filled 2018-02-02: qty 296

## 2018-02-02 MED ORDER — KETOROLAC TROMETHAMINE 30 MG/ML IJ SOLN
INTRAMUSCULAR | Status: AC
  Filled 2018-02-02: qty 1

## 2018-02-02 MED ORDER — NEOMYCIN-POLYMYXIN B GU 40-200000 IR SOLN
Status: DC | PRN
  Administered 2018-02-02: 14 mL

## 2018-02-02 MED ORDER — METHOCARBAMOL 500 MG PO TABS
500.0000 mg | ORAL_TABLET | Freq: Four times a day (QID) | ORAL | Status: DC | PRN
  Administered 2018-02-03: 500 mg via ORAL
  Filled 2018-02-02: qty 1

## 2018-02-02 MED ORDER — INSULIN ASPART 100 UNIT/ML ~~LOC~~ SOLN
0.0000 [IU] | Freq: Three times a day (TID) | SUBCUTANEOUS | Status: DC
  Administered 2018-02-02: 3 [IU] via SUBCUTANEOUS
  Administered 2018-02-02: 5 [IU] via SUBCUTANEOUS
  Administered 2018-02-03: 3 [IU] via SUBCUTANEOUS
  Administered 2018-02-03: 2 [IU] via SUBCUTANEOUS
  Administered 2018-02-03: 3 [IU] via SUBCUTANEOUS
  Administered 2018-02-04: 2 [IU] via SUBCUTANEOUS
  Administered 2018-02-04: 3 [IU] via SUBCUTANEOUS
  Administered 2018-02-04: 2 [IU] via SUBCUTANEOUS
  Filled 2018-02-02 (×8): qty 1

## 2018-02-02 MED ORDER — ONDANSETRON HCL 4 MG/2ML IJ SOLN
INTRAMUSCULAR | Status: DC | PRN
  Administered 2018-02-02: 4 mg via INTRAVENOUS

## 2018-02-02 MED ORDER — PROPOFOL 10 MG/ML IV BOLUS
INTRAVENOUS | Status: AC
  Filled 2018-02-02: qty 20

## 2018-02-02 MED ORDER — PHENYLEPHRINE HCL 10 MG/ML IJ SOLN
INTRAMUSCULAR | Status: DC | PRN
  Administered 2018-02-02 (×3): 100 ug via INTRAVENOUS

## 2018-02-02 MED ORDER — EPHEDRINE SULFATE 50 MG/ML IJ SOLN
INTRAMUSCULAR | Status: DC | PRN
  Administered 2018-02-02 (×2): 5 mg via INTRAVENOUS

## 2018-02-02 MED ORDER — FENTANYL CITRATE (PF) 100 MCG/2ML IJ SOLN: 25.0000 ug | INTRAMUSCULAR | Status: DC | PRN

## 2018-02-02 MED ORDER — CEFAZOLIN SODIUM-DEXTROSE 2-4 GM/100ML-% IV SOLN
INTRAVENOUS | Status: AC
  Filled 2018-02-02: qty 100

## 2018-02-02 MED ORDER — SODIUM CHLORIDE 0.9 % IV SOLN
INTRAVENOUS | Status: DC | PRN
  Administered 2018-02-02: 20 ug/min via INTRAVENOUS

## 2018-02-02 MED ORDER — METOCLOPRAMIDE HCL 10 MG PO TABS: 5.0000 mg | ORAL_TABLET | Freq: Three times a day (TID) | ORAL | Status: DC | PRN

## 2018-02-02 MED ORDER — ONDANSETRON HCL 4 MG/2ML IJ SOLN: 4.0000 mg | Freq: Four times a day (QID) | INTRAMUSCULAR | Status: DC | PRN

## 2018-02-02 MED ORDER — HYDROMORPHONE HCL 2 MG PO TABS
1.0000 mg | ORAL_TABLET | ORAL | Status: DC | PRN
  Administered 2018-02-02 – 2018-02-05 (×8): 1 mg via ORAL
  Filled 2018-02-02 (×8): qty 1

## 2018-02-02 MED ORDER — METFORMIN HCL ER 500 MG PO TB24
1000.0000 mg | ORAL_TABLET | Freq: Every day | ORAL | Status: DC
  Filled 2018-02-02: qty 2

## 2018-02-02 MED ORDER — PIOGLITAZONE HCL 30 MG PO TABS
30.0000 mg | ORAL_TABLET | Freq: Every evening | ORAL | Status: DC
  Administered 2018-02-02 – 2018-02-04 (×3): 30 mg via ORAL
  Filled 2018-02-02 (×4): qty 1

## 2018-02-02 MED ORDER — SENNOSIDES-DOCUSATE SODIUM 8.6-50 MG PO TABS: 1.0000 | ORAL_TABLET | Freq: Every evening | ORAL | Status: DC | PRN

## 2018-02-02 MED ORDER — SIMVASTATIN 20 MG PO TABS
40.0000 mg | ORAL_TABLET | Freq: Every evening | ORAL | Status: DC
  Administered 2018-02-02 – 2018-02-04 (×3): 40 mg via ORAL
  Filled 2018-02-02 (×3): qty 2

## 2018-02-02 MED ORDER — ASPIRIN EC 325 MG PO TBEC
325.0000 mg | DELAYED_RELEASE_TABLET | Freq: Every day | ORAL | Status: DC
  Administered 2018-02-03 – 2018-02-05 (×3): 325 mg via ORAL
  Filled 2018-02-02 (×3): qty 1

## 2018-02-02 MED ORDER — SODIUM CHLORIDE 0.9 % IJ SOLN
INTRAMUSCULAR | Status: AC
  Filled 2018-02-02: qty 100

## 2018-02-02 MED ORDER — MENTHOL 3 MG MT LOZG
1.0000 | LOZENGE | OROMUCOSAL | Status: DC | PRN
  Filled 2018-02-02: qty 9

## 2018-02-02 MED ORDER — GABAPENTIN 300 MG PO CAPS
300.0000 mg | ORAL_CAPSULE | Freq: Three times a day (TID) | ORAL | Status: DC
  Administered 2018-02-02 – 2018-02-05 (×9): 300 mg via ORAL
  Filled 2018-02-02 (×9): qty 1

## 2018-02-02 MED ORDER — SODIUM CHLORIDE 0.9 % IV SOLN
INTRAVENOUS | Status: DC
  Administered 2018-02-02 (×2): via INTRAVENOUS

## 2018-02-02 MED ORDER — METHOCARBAMOL 1000 MG/10ML IJ SOLN
500.0000 mg | Freq: Four times a day (QID) | INTRAVENOUS | Status: DC | PRN
  Filled 2018-02-02: qty 5

## 2018-02-02 MED ORDER — DIPHENHYDRAMINE HCL 12.5 MG/5ML PO ELIX: 12.5000 mg | ORAL_SOLUTION | ORAL | Status: DC | PRN

## 2018-02-02 MED ORDER — METOCLOPRAMIDE HCL 5 MG/ML IJ SOLN: 5.0000 mg | Freq: Three times a day (TID) | INTRAMUSCULAR | Status: DC | PRN

## 2018-02-02 MED ORDER — MORPHINE SULFATE 10 MG/ML IJ SOLN
INTRAMUSCULAR | Status: DC | PRN
  Administered 2018-02-02: 10 mg via INTRAVENOUS

## 2018-02-02 MED ORDER — INSULIN DEGLUDEC 200 UNIT/ML ~~LOC~~ SOPN: 20.0000 [IU] | PEN_INJECTOR | Freq: Every day | SUBCUTANEOUS | Status: DC

## 2018-02-02 MED ORDER — BUPIVACAINE LIPOSOME 1.3 % IJ SUSP
INTRAMUSCULAR | Status: AC
  Filled 2018-02-02: qty 20

## 2018-02-02 MED ORDER — BISACODYL 5 MG PO TBEC
5.0000 mg | DELAYED_RELEASE_TABLET | Freq: Every day | ORAL | Status: DC | PRN
  Administered 2018-02-04: 5 mg via ORAL
  Filled 2018-02-02: qty 1

## 2018-02-02 MED ORDER — MORPHINE SULFATE (PF) 10 MG/ML IV SOLN
INTRAVENOUS | Status: AC
  Filled 2018-02-02: qty 1

## 2018-02-02 MED ORDER — LIDOCAINE HCL (PF) 2 % IJ SOLN
INTRAMUSCULAR | Status: DC | PRN
  Administered 2018-02-02: 60 mg

## 2018-02-02 MED ORDER — INSULIN DETEMIR 100 UNIT/ML ~~LOC~~ SOLN
20.0000 [IU] | Freq: Every day | SUBCUTANEOUS | Status: DC
  Administered 2018-02-02 – 2018-02-04 (×3): 20 [IU] via SUBCUTANEOUS
  Filled 2018-02-02 (×4): qty 0.2

## 2018-02-02 MED ORDER — MORPHINE SULFATE (PF) 2 MG/ML IV SOLN: 0.5000 mg | INTRAVENOUS | Status: DC | PRN

## 2018-02-02 MED ORDER — PHENYLEPHRINE HCL 10 MG/ML IJ SOLN
INTRAMUSCULAR | Status: AC
  Filled 2018-02-02: qty 1

## 2018-02-02 MED ORDER — SODIUM CHLORIDE 0.9 % IJ SOLN
INTRAMUSCULAR | Status: AC
  Filled 2018-02-02: qty 10

## 2018-02-02 MED ORDER — DEXAMETHASONE SODIUM PHOSPHATE 10 MG/ML IJ SOLN
INTRAMUSCULAR | Status: DC | PRN
  Administered 2018-02-02: 10 mg via INTRAVENOUS

## 2018-02-02 MED ORDER — MIDAZOLAM HCL 2 MG/2ML IJ SOLN
INTRAMUSCULAR | Status: AC
  Filled 2018-02-02: qty 2

## 2018-02-02 MED ORDER — KETOROLAC TROMETHAMINE 30 MG/ML IJ SOLN
INTRAMUSCULAR | Status: DC | PRN
  Administered 2018-02-02: 30 mg via INTRAVENOUS

## 2018-02-02 MED ORDER — PANTOPRAZOLE SODIUM 40 MG PO TBEC
40.0000 mg | DELAYED_RELEASE_TABLET | Freq: Every day | ORAL | Status: DC
  Administered 2018-02-03 – 2018-02-05 (×3): 40 mg via ORAL
  Filled 2018-02-02 (×3): qty 1

## 2018-02-02 MED ORDER — BUPIVACAINE HCL (PF) 0.5 % IJ SOLN
INTRAMUSCULAR | Status: DC | PRN
  Administered 2018-02-02: 3 mL via INTRATHECAL

## 2018-02-02 SURGICAL SUPPLY — 64 items
BANDAGE ACE 6X5 VEL STRL LF (GAUZE/BANDAGES/DRESSINGS) ×3 IMPLANT
BLADE SAW 1 (BLADE) ×3 IMPLANT
CANISTER SUCT 1200ML W/VALVE (MISCELLANEOUS) ×3 IMPLANT
CANISTER SUCT 3000ML PPV (MISCELLANEOUS) ×6 IMPLANT
CEMENT HV SMART SET (Cement) ×6 IMPLANT
CHLORAPREP W/TINT 26ML (MISCELLANEOUS) ×3 IMPLANT
COOLER POLAR GLACIER W/PUMP (MISCELLANEOUS) ×3 IMPLANT
CUFF TOURN 24 STER (MISCELLANEOUS) IMPLANT
CUFF TOURN 30 STER DUAL PORT (MISCELLANEOUS) ×3 IMPLANT
DRAPE SHEET LG 3/4 BI-LAMINATE (DRAPES) ×6 IMPLANT
ELECT CAUTERY BLADE 6.4 (BLADE) ×3 IMPLANT
ELECT REM PT RETURN 9FT ADLT (ELECTROSURGICAL) ×3
ELECTRODE REM PT RTRN 9FT ADLT (ELECTROSURGICAL) ×1 IMPLANT
FEM COMP 5 LT CEMENT 02120005L (Femur) ×3 IMPLANT
GAUZE PETRO XEROFOAM 1X8 (MISCELLANEOUS) ×3 IMPLANT
GAUZE SPONGE 4X4 12PLY STRL (GAUZE/BANDAGES/DRESSINGS) ×3 IMPLANT
GLOVE BIOGEL PI IND STRL 9 (GLOVE) ×1 IMPLANT
GLOVE BIOGEL PI INDICATOR 9 (GLOVE) ×2
GLOVE INDICATOR 8.0 STRL GRN (GLOVE) ×3 IMPLANT
GLOVE SURG ORTHO 8.0 STRL STRW (GLOVE) ×3 IMPLANT
GLOVE SURG SYN 9.0  PF PI (GLOVE) ×2
GLOVE SURG SYN 9.0 PF PI (GLOVE) ×1 IMPLANT
GOWN SRG 2XL LVL 4 RGLN SLV (GOWNS) ×1 IMPLANT
GOWN STRL NON-REIN 2XL LVL4 (GOWNS) ×2
GOWN STRL REUS W/ TWL LRG LVL3 (GOWN DISPOSABLE) ×1 IMPLANT
GOWN STRL REUS W/ TWL XL LVL3 (GOWN DISPOSABLE) ×1 IMPLANT
GOWN STRL REUS W/TWL LRG LVL3 (GOWN DISPOSABLE) ×2
GOWN STRL REUS W/TWL XL LVL3 (GOWN DISPOSABLE) ×2
HOLDER FOLEY CATH W/STRAP (MISCELLANEOUS) ×3 IMPLANT
HOOD PEEL AWAY FLYTE STAYCOOL (MISCELLANEOUS) ×6 IMPLANT
IMMBOLIZER KNEE 19 BLUE UNIV (SOFTGOODS) IMPLANT
KIT PREVENA INCISION MGT20CM45 (CANNISTER) ×3 IMPLANT
KIT TURNOVER KIT A (KITS) ×3 IMPLANT
KNIFE SCULPS 14X20 (INSTRUMENTS) ×3 IMPLANT
NDL SAFETY ECLIPSE 18X1.5 (NEEDLE) ×1 IMPLANT
NEEDLE HYPO 18GX1.5 SHARP (NEEDLE) ×2
NEEDLE SPNL 18GX3.5 QUINCKE PK (NEEDLE) ×3 IMPLANT
NEEDLE SPNL 20GX3.5 QUINCKE YW (NEEDLE) ×3 IMPLANT
NS IRRIG 1000ML POUR BTL (IV SOLUTION) ×3 IMPLANT
PACK TOTAL KNEE (MISCELLANEOUS) ×3 IMPLANT
PAD WRAPON POLAR KNEE (MISCELLANEOUS) ×1 IMPLANT
PATELLA RESURFACING MEDACTA SZ (Bone Implant) ×6 IMPLANT
PULSAVAC PLUS IRRIG FAN TIP (DISPOSABLE) ×3
SCALPEL PROTECTED #10 DISP (BLADE) ×6 IMPLANT
SOL .9 NS 3000ML IRR  AL (IV SOLUTION) ×2
SOL .9 NS 3000ML IRR UROMATIC (IV SOLUTION) ×1 IMPLANT
STAPLER SKIN PROX 35W (STAPLE) ×3 IMPLANT
STEM EXTENSION 11MMX30MM (Stem) ×3 IMPLANT
SUCTION FRAZIER HANDLE 10FR (MISCELLANEOUS) ×2
SUCTION TUBE FRAZIER 10FR DISP (MISCELLANEOUS) ×1 IMPLANT
SUT DVC 2 QUILL PDO  T11 36X36 (SUTURE) ×2
SUT DVC 2 QUILL PDO T11 36X36 (SUTURE) ×1 IMPLANT
SUT TICRON 2-0 30IN 311381 (SUTURE) ×3 IMPLANT
SUT V-LOC 90 ABS DVC 3-0 CL (SUTURE) ×3 IMPLANT
SYR 20CC LL (SYRINGE) ×3 IMPLANT
SYR 50ML LL SCALE MARK (SYRINGE) ×6 IMPLANT
TIBIAL INSERT SZ4 LT 02120410F (Insert) ×3 IMPLANT
TIBIAL TRAY FIXED MEDACTA 0207 (Bone Implant) ×3 IMPLANT
TIP FAN IRRIG PULSAVAC PLUS (DISPOSABLE) ×1 IMPLANT
TOWEL OR 17X26 4PK STRL BLUE (TOWEL DISPOSABLE) ×3 IMPLANT
TOWER CARTRIDGE SMART MIX (DISPOSABLE) ×3 IMPLANT
TRAY FOLEY MTR SLVR 16FR STAT (SET/KITS/TRAYS/PACK) ×3 IMPLANT
WND VAC CANISTER 500ML (MISCELLANEOUS) ×3 IMPLANT
WRAPON POLAR PAD KNEE (MISCELLANEOUS) ×3

## 2018-02-02 NOTE — Anesthesia Post-op Follow-up Note (Signed)
Anesthesia QCDR form completed.        

## 2018-02-02 NOTE — Progress Notes (Signed)
Pt arrived to room 156 from PACU. Pt is A&Ox4, VSS. Wound vac, polar care, and foley in place. Pt and family educated on pain medication regimen and plan of care. Pt could bend both knees at time of arrival and had tingling in feet.   Kenneth, Jerry Caras

## 2018-02-02 NOTE — Anesthesia Procedure Notes (Addendum)
Spinal  Patient location during procedure: OR Start time: 02/02/2018 7:24 AM End time: 02/02/2018 7:27 AM Staffing Anesthesiologist: Pen[place]en, Amy, MD Resident/CRNA: Logan, Benjamin, CRNA Other anesthesia staff: Louw, Jared P, RN Performed: other anesthesia staff  Preanesthetic Checklist Completed: patient identified, site marked, surgical consent, pre-op evaluation, timeout performed, IV checked, risks and benefits discussed and monitors and equipment checked Spinal Block Patient position: sitting Prep: ChloraPrep and site prepped and draped Patient monitoring: heart rate, continuous pulse ox, blood pressure and cardiac monitor Approach: midline Location: L4-5 Injection technique: single-shot Needle Needle type: Introducer and Pencan  Needle gauge: 24 G Needle length: 9 cm Additional Notes Negative paresthesia. Negative blood return. Positive free-flowing CSF. Expiration date of kit checked and confirmed. Patient tolerated procedure well, without complications.       

## 2018-02-02 NOTE — Evaluation (Signed)
Physical Therapy Evaluation Patient Details Name: Keith Holland MRN: [id number] DOB: 05-Mar-1956 Today's Date: 02/02/2018   History of Present Illness  Pt. prestents to hospital status post TKR L with a PMH to include DM type 2, arthritis, prostate cancer, and vertigo. Pt is post op day 0.  Clinical Impression  Pt is a pleasant 62 year old male who was admitted for status post L TKR. Pt performs SLR x10 with no assist, and therefore does not need knee immobilizer. Pt performs bed mobility with Min A at LE secondary to pain, transfers sit to stand with CGA for unsteadiness with verbal cueing for hand placement, and ambulation with CGA for safety with verbal cueing for RW use . Pt demonstrates deficits with functional mobility due to pain, weakness, and unsteadiness. Pt would benefit from skilled PT to address above deficits and promote optimal return to PLOF. PT recommends d/c to home with home health with wife at home to assist as needed.  Pt is motivate and should progress well.      Follow Up Recommendations Home health PT    Equipment Recommendations  3in1 (PT)    Recommendations for Other Services       Precautions / Restrictions Precautions Precautions: Knee Precaution Booklet Issued: No Precaution Comments: WBAT. will issue knee booklet tomorrow Restrictions Weight Bearing Restrictions: Yes LLE Weight Bearing: Weight bearing as tolerated      Mobility  Bed Mobility Overal bed mobility: Needs Assistance Bed Mobility: Supine to Sit     Supine to sit: Min assist     General bed mobility comments: PT provided Min A at L LE and trunk secondary to pain   Transfers Overall transfer level: Needs assistance Equipment used: Rolling walker (2 wheeled) Transfers: Sit to/from Stand Sit to Stand: Min guard         General transfer comment: Pt required CGA for safety and unsteadiness. Pt benefited from verbal cueing for correct placement of  UE.  Ambulation/Gait Ambulation/Gait assistance: Min guard Gait Distance (Feet): 10 Feet Assistive device: Rolling walker (2 wheeled) Gait Pattern/deviations: Step-to pattern     General Gait Details: Pt ambulated 10' in room with PT CGA for safety, and verbal cueing to keep RW at safe distance.  Stairs            Wheelchair Mobility    Modified Rankin (Stroke Patients Only)       Balance Overall balance assessment: Needs assistance Sitting-balance support: No upper extremity supported;Feet supported       Standing balance support: Bilateral upper extremity supported(on RW)                                 Pertinent Vitals/Pain Pain Assessment: 0-10 Pain Score: 5  Pain Location: L knee Pain Descriptors / Indicators: Dull Pain Intervention(s): Limited activity within patient's tolerance;Monitored during session;Ice applied    Home Living Family/patient expects to be discharged to:: Private residence Living Arrangements: Spouse/significant other Available Help at Discharge: Family Type of Home: House Home Access: Stairs to enter Entrance Stairs-Rails: Left(left(4) Left or right(6)) Entrance Stairs-Number of Steps: 4 or 6 Home Layout: One level Home Equipment: [place] - 2 wheels;Walker - 4 wheels      Prior Function Level of Independence: Independent               Hand Dominance   Dominant Hand: Right    Extremity/Trunk Assessment   Upper Extremity Assessment Upper  Extremity Assessment: Overall WFL for tasks assessed    Lower Extremity Assessment Lower Extremity Assessment: Generalized weakness(Involved leg grossly 3+/5 MMT. Uninvolved leg grossly 4+/5)       Communication   Communication: No difficulties  Cognition Arousal/Alertness: Awake/alert Behavior During Therapy: WFL for tasks assessed/performed Overall Cognitive Status: Within Functional Limits for tasks assessed                                         General Comments      Exercises Total Joint Exercises Goniometric ROM: L knee AAROM 5-90 deg Other Exercises Other Exercises: Pt instructed in supine there.ex to include ankle pumps x10, quad/glute sets x5, MIn A hip abd/add x10, and SLR x10 all on the L LE   Assessment/Plan    PT Assessment Patient needs continued PT services  PT Problem List Decreased strength;Decreased range of motion;Decreased activity tolerance;Decreased balance;Decreased mobility;Decreased knowledge of use of DME;Pain       PT Treatment Interventions DME instruction;Gait training;Stair training;Functional mobility training;Therapeutic exercise;Therapeutic activities;Balance training;Neuromuscular re-education;Patient/family education    PT Goals (Current goals can be found in the Care Plan section)  Acute Rehab PT Goals Patient Stated Goal: go home PT Goal Formulation: With patient Time For Goal Achievement: 02/16/18 Potential to Achieve Goals: Good    Frequency BID   Barriers to discharge        Co-evaluation               AM-PAC PT "6 Clicks" Daily Activity  Outcome Measure Difficulty turning over in bed (including adjusting bedclothes, sheets and blankets)?: Unable Difficulty moving from lying on back to sitting on the side of the bed? : Unable Difficulty sitting down on and standing up from a chair with arms (e.g., wheelchair, bedside commode, etc,.)?: Unable Help needed moving to and from a bed to chair (including a wheelchair)?: A Little Help needed walking in hospital room?: A Little Help needed climbing 3-5 steps with a railing? : A Lot 6 Click Score: 11    End of Session Equipment Utilized During Treatment: Gait belt Activity Tolerance: Patient tolerated treatment well(On room air) Patient left: in chair;with call bell/phone within reach;with chair alarm set;with family/visitor present Nurse Communication: Mobility status PT Visit Diagnosis: Unsteadiness on feet  (R26.81);Other abnormalities of gait and mobility (R26.89);Muscle weakness (generalized) (M62.81);Pain;Difficulty in walking, not elsewhere classified (R26.2) Pain - Right/Left: Left Pain - part of body: Knee    Time: 0325-0359 PT Time Calculation (min) (ACUTE ONLY): 34 min   Charges:   PT Evaluation $PT Eval Low Complexity: 1 Low PT Treatments $Therapeutic Exercise: 8-22 mins   PT G Codes:        Rosario Adie, SPT   Rosario Adie 02/02/2018, 5:14 PM

## 2018-02-02 NOTE — Anesthesia Preprocedure Evaluation (Signed)
Anesthesia Evaluation  Patient identified by MRN, date of birth, ID band Patient awake    Reviewed: Allergy & Precautions, H&P , NPO status , Patient's Chart, lab work & pertinent test results, reviewed documented beta blocker date and time   History of Anesthesia Complications (+) PONV and history of anesthetic complications  Airway Mallampati: II   Neck ROM: full    Dental  (+) Teeth Intact   Pulmonary neg pulmonary ROS,    Pulmonary exam normal        Cardiovascular Exercise Tolerance: Good negative cardio ROS Normal cardiovascular exam Rhythm:regular Rate:Normal     Neuro/Psych negative neurological ROS  negative psych ROS   GI/Hepatic negative GI ROS, Neg liver ROS, GERD  Medicated,  Endo/Other  negative endocrine ROSdiabetes, Well Controlled, Type 2, Insulin Dependent, Oral Hypoglycemic Agents  Renal/GU negative Renal ROS  negative genitourinary   Musculoskeletal   Abdominal   Peds  Hematology negative hematology ROS (+)   Anesthesia Other Findings Past Medical History: No date: Arthritis 2018: Cancer ([place])     Comment:  Prostate- December 30, 2016 removed No date: Complication of anesthesia     Comment:  severe n/v No date: Diabetes mellitus without complication (HCC) No date: GERD (gastroesophageal reflux disease) No date: Hyperlipidemia No date: PONV (postoperative nausea and vomiting) No date: SAH (subarachnoid hemorrhage) ([place])     Comment:  spontaneous 2000 n surgery No date: Vertigo     Comment:  since childhood if on back long will get vertigo  Past Surgical History: No date: MENISCUS REPAIR     Comment:  x 3 12/29/2016: ROBOT ASSISTED LAPAROSCOPIC RADICAL PROSTATECTOMY; N/A     Comment:  Procedure: ROBOTIC ASSISTED LAPAROSCOPIC RADICAL               PROSTATECTOMY;  Surgeon: Hollice Espy, MD;  Location:               [facility] ORS;  Service: Urology;  Laterality: N/A; BMI    Body Mass Index:   31.96 kg/m     Reproductive/Obstetrics negative OB ROS                             Anesthesia Physical Anesthesia Plan  ASA: III  Anesthesia Plan: General   Post-op Pain Management:    Induction:   PONV Risk Score and Plan: 4 or greater  Airway Management Planned:   Additional Equipment:   Intra-op Plan:   Post-operative Plan:   Informed Consent: I have reviewed the patients History and Physical, chart, labs and discussed the procedure including the risks, benefits and alternatives for the proposed anesthesia with the patient or authorized representative who has indicated his/her understanding and acceptance.   Dental Advisory Given  Plan Discussed with: CRNA  Anesthesia Plan Comments:         Anesthesia Quick Evaluation

## 2018-02-02 NOTE — Transfer of Care (Signed)
Immediate Anesthesia Transfer of Care Note  Patient: Keith Holland  Procedure(s) Performed: TOTAL KNEE ARTHROPLASTY (Left )  Patient Location: PACU  Anesthesia Type:Spinal  Level of Consciousness: awake, alert  and oriented  Airway & Oxygen Therapy: Patient Spontanous Breathing and Patient connected to face mask oxygen  Post-op Assessment: Report given to RN and Post -op Vital signs reviewed and stable  Post vital signs: Reviewed  Last Vitals:  Vitals Value Taken Time  BP 109/70 02/02/2018  9:29 AM  Temp 36.3 C 02/02/2018  9:28 AM  Pulse 77 02/02/2018  9:29 AM  Resp 12 02/02/2018  9:29 AM  SpO2 97 % 02/02/2018  9:29 AM  Vitals shown include unvalidated device data.  Last Pain:  Vitals:   02/02/18 0640  TempSrc: Tympanic  PainSc: 4          Complications: No apparent anesthesia complications

## 2018-02-02 NOTE — Anesthesia Procedure Notes (Deleted)
Spinal

## 2018-02-02 NOTE — NC FL2 (Signed)
[place] LEVEL OF CARE SCREENING TOOL     IDENTIFICATION  Patient Name: Keith Holland Birthdate: [birth year] Sex: male Admission Date (Current Location): 02/02/2018  [place] and [place] Number:  Engineering geologist and Address:  [facility], [address] [place], [place] [address]      Provider Number: [id number]  Attending Physician Name and Address:  Hessie Knows, MD  Relative Name and Phone Number:       Current Level of Care: Hospital Recommended Level of Care: [place] Prior Approval Number:    Date Approved/Denied:   PASRR Number: [id number])  Discharge Plan: SNF    Current Diagnoses: Patient Active Problem List   Diagnosis Date Noted  . 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 Prostate cancer ([place]) 11/30/2016  . Elevated PSA 10/13/2016    Orientation RESPIRATION BLADDER Height & Weight     Self, Time, Situation, Place  Normal Continent Weight: 89.8 kg (198 lb) Height:  5\' 6"  (167.6 cm)  BEHAVIORAL SYMPTOMS/MOOD NEUROLOGICAL BOWEL NUTRITION STATUS      Continent Diet(Diet: Carb Modified. )  AMBULATORY STATUS COMMUNICATION OF NEEDS Skin   Extensive Assist Verbally Surgical wounds, Wound Vac(Incision: Left Knee, Provena wound vac. )                       Personal Care Assistance Level of Assistance  Bathing, Feeding, Dressing Bathing Assistance: Limited assistance Feeding assistance: Independent Dressing Assistance: Limited assistance     Functional Limitations Info  Sight, Hearing, Speech Sight Info: Adequate Hearing Info: Adequate Speech Info: Adequate    SPECIAL CARE FACTORS FREQUENCY  PT (By licensed PT), OT (By licensed OT)     PT Frequency: (5) OT Frequency: (5)            Contractures      Additional Factors Info  Code Status, Allergies Code Status Info: (Full Code. ) Allergies Info: (Percocet Oxycodone-acetaminophen, Vicodin  Hydrocodone- acetaminophen)           Current Medications (02/02/2018):  This is the current hospital active medication list Current Facility-Administered Medications  Medication Dose Route Frequency Provider Last Rate Last Dose  . 0.9 %  sodium chloride infusion   Intravenous Continuous Hessie Knows, MD 75 mL/hr at 02/02/18 1201    . [START ON 02/03/2018] acetaminophen (TYLENOL) tablet 325-650 mg  325-650 mg Oral Q6H PRN Hessie Knows, MD      . acetaminophen (TYLENOL) tablet 500 mg  500 mg Oral Q6H Hessie Knows, MD   500 mg at 02/02/18 1156  . alum & mag hydroxide-simeth (MAALOX/MYLANTA) 200-200-20 MG/5ML suspension 30 mL  30 mL Oral Q4H PRN Hessie Knows, MD      . Derrill Memo ON 02/03/2018] aspirin EC tablet 325 mg  325 mg Oral Q breakfast Hessie Knows, MD      . bisacodyl (DULCOLAX) EC tablet 5 mg  5 mg Oral Daily PRN Hessie Knows, MD      . ceFAZolin (ANCEF) IVPB 2g/100 mL premix  2 g Intravenous Q6H Hessie Knows, MD   Stopped at 02/02/18 1313  . diphenhydrAMINE (BENADRYL) 12.5 MG/5ML elixir 12.5-25 mg  12.5-25 mg Oral Q4H PRN Hessie Knows, MD      . docusate sodium (COLACE) capsule 100 mg  100 mg Oral BID Hessie Knows, MD      . gabapentin (NEURONTIN) capsule 300 mg  300 mg Oral TID Hessie Knows, MD      .  HYDROmorphone (DILAUDID) tablet 1 mg  1 mg Oral Q3H PRN Hessie Knows, MD      . insulin aspart (novoLOG) injection 0-15 Units  0-15 Units Subcutaneous TID WC Hessie Knows, MD   3 Units at 02/02/18 1243  . insulin detemir (LEVEMIR) injection 20 Units  20 Units Subcutaneous QHS Hessie Knows, MD      . magnesium citrate solution 1 Bottle  1 Bottle Oral Once PRN Hessie Knows, MD      . menthol-cetylpyridinium (CEPACOL) lozenge 3 mg  1 lozenge Oral PRN Hessie Knows, MD       Or  . phenol (CHLORASEPTIC) mouth spray 1 spray  1 spray Mouth/Throat PRN Hessie Knows, MD      . metFORMIN (GLUCOPHAGE-XR) 24 hr tablet 1,000 mg  1,000 mg Oral Q supper Hessie Knows, MD      . methocarbamol  (ROBAXIN) tablet 500 mg  500 mg Oral Q6H PRN Hessie Knows, MD       Or  . methocarbamol (ROBAXIN) 500 mg in dextrose 5 % 50 mL IVPB  500 mg Intravenous Q6H PRN Hessie Knows, MD      . metoCLOPramide (REGLAN) tablet 5-10 mg  5-10 mg Oral Q8H PRN Hessie Knows, MD       Or  . metoCLOPramide (REGLAN) injection 5-10 mg  5-10 mg Intravenous Q8H PRN Hessie Knows, MD      . morphine 2 MG/ML injection 0.5-1 mg  0.5-1 mg Intravenous Q2H PRN Hessie Knows, MD      . ondansetron [facility]) tablet 4 mg  4 mg Oral Q6H PRN Hessie Knows, MD       Or  . ondansetron [facility]) injection 4 mg  4 mg Intravenous Q6H PRN Hessie Knows, MD      . Derrill Memo ON 02/03/2018] pantoprazole (PROTONIX) EC tablet 40 mg  40 mg Oral Daily Hessie Knows, MD      . pioglitazone (ACTOS) tablet 30 mg  30 mg Oral QPM Hessie Knows, MD      . senna-docusate (Senokot-S) tablet 1 tablet  1 tablet Oral QHS PRN Hessie Knows, MD      . simvastatin (ZOCOR) tablet 40 mg  40 mg Oral QPM Hessie Knows, MD      . traMADol [facility]at[facility]) tablet 50 mg  50 mg Oral Q6H Hessie Knows, MD   50 mg at 02/02/18 1156  . zolpidem (AMBIEN) tablet 5 mg  5 mg Oral QHS PRN,MR X 1 Hessie Knows, MD         Discharge Medications: Please see discharge summary for a list of discharge medications.  Relevant Imaging Results:  Relevant Lab Results:   Additional Information (SSN: [id number])  Bransyn Adami, Veronia Beets, LCSW

## 2018-02-02 NOTE — H&P (Signed)
Reviewed paper H+P, will be scanned into chart. No changes noted.  

## 2018-02-02 NOTE — Op Note (Signed)
02/02/2018  9:26 AM  PATIENT:  Keith Holland  62 y.o. male  PRE-OPERATIVE DIAGNOSIS:  secondary osteoarthritis of left knee  POST-OPERATIVE DIAGNOSIS:  secondary osteoarthritis of left knee  PROCEDURE:  Procedure(s): TOTAL KNEE ARTHROPLASTY (Left)  SURGEON: Laurene Footman, MD  ASSISTANTS: Rachelle Hora, PA-C  ANESTHESIA:   spinal  EBL:  Total I/O In: 1000 [I.V.:1000] Out: 1050 [Urine:950; Blood:100]  BLOOD ADMINISTERED:none  DRAINS: none   LOCAL MEDICATIONS USED:  MARCAINE    and OTHER Toradol Exparel and morphine  SPECIMEN:  No Specimen  DISPOSITION OF SPECIMEN:  N/A  COUNTS:  YES  TOURNIQUET: 68 minutes at 300 mmHg  IMPLANTS: Medacta left GMK Sphere 5 left femur, 4 left tibia with 10 mm insert short stem and size 3 patella, all components cemented  DICTATION: .Dragon Dictation patient was brought to the operating room and after adequate anesthesia was obtained the left leg was prepped and draped you sterile fashion with tourniquet by the upper thigh.  After patient identification and timeout procedure was completed the tourniquet was raised and midline skin incision was made followed by medial parapatellar arthrotomy.  Inspection revealed significant patellofemoral degenerative change of medial compartment with moderate mild lateral compartment Oster arthritis.  There is also synovitis present within the joint which was excised.  The ACL and fat pad were excised as well.  The proximal tibia was exposed and the extra medullary tibial alignment guide was placed and proximal tibia cut carried out.  The distal femoral drill hole was then made in the distal femoral cut made the gap gauge used to make sure adequate resection had been performed and after additional resection of both sides of the joint the 11 mm gap gauge fit well in extension.  The femur was then prepared with the sizing guide and sized to a size 5 anterior posterior and chamfer cuts made with appropriate size cuts  and this appeared to fit well the truck trial was placed distal drill holes made followed by the trochlear groove cut.  The tibia was prepared using the tibial template baseplate size 4 this was pinned into position proximal tibial preparation carried out for short stem.  A 10 mm insert gave excellent stability and full extension with approximately 10 to 15 degree flexion contracture of the start of the case.  The trials were removed at this point and the bony surfaces thoroughly irrigated and dried after the patellar cutting been carried out with the patellar cutting guide drill holes made and sized to a size 3 the above local was then infiltrated in the periarticular tissues and the bony surfaces dried the tibial component was cemented to place first with excess cement removed followed by the polyethylene component with the set screws and a torque screwdriver.  The femoral component was placed in the knee held in extension as the cement set with the patellar button clamped into place.  After the cemented set excess cement was removed and the knee was thoroughly again thoroughly irrigated with tourniquet let down.  A lateral release was performed because of some tendency for the patella to want to track laterally.  The arthrotomy was then repaired using #2 Ethibond to hold the medial capsule initially and then a heavy Quill.3-0 v-loc cuticular followed by skin staples.  Incisional wound VAC applied  PLAN OF CARE: Admit to inpatient   PATIENT DISPOSITION:  PACU - hemodynamically stable.   '

## 2018-02-02 NOTE — Anesthesia Postprocedure Evaluation (Signed)
Anesthesia Post Note  Patient: Charlene Cowdrey Mcallister  Procedure(s) Performed: TOTAL KNEE ARTHROPLASTY (Left )  Patient location during evaluation: PACU Anesthesia Type: Spinal Level of consciousness: awake and alert Pain management: pain level controlled Vital Signs Assessment: post-procedure vital signs reviewed and stable Respiratory status: spontaneous breathing, nonlabored ventilation, respiratory function stable and patient connected to nasal cannula oxygen Cardiovascular status: blood pressure returned to baseline and stable Postop Assessment: no apparent nausea or vomiting Anesthetic complications: no     Last Vitals:  Vitals:   02/02/18 1114 02/02/18 1135  BP:  116/76  Pulse: 66 (!) 59  Resp: 15 16  Temp: (!) 36.2 C   SpO2: 96% 97%    Last Pain:  Vitals:   02/02/18 1114  TempSrc:   PainSc: 0-No pain                 Molli Barrows

## 2018-02-03 LAB — BASIC METABOLIC PANEL
Anion gap: 4 — ABNORMAL LOW (ref 5–15)
BUN: 12 mg/dL (ref 8–23)
CHLORIDE: 110 mmol/L (ref 98–111)
CO2: 26 mmol/L (ref 22–32)
Calcium: 8.4 mg/dL — ABNORMAL LOW (ref 8.9–10.3)
Creatinine, Ser: 0.66 mg/dL (ref 0.61–1.24)
GFR calc Af Amer: >60 mL/min (ref >60)
Glucose, Bld: 161 mg/dL — ABNORMAL HIGH (ref 70–99)
POTASSIUM: 4.3 mmol/L (ref 3.5–5.1)
Sodium: 140 mmol/L (ref 135–145)

## 2018-02-03 LAB — CBC
HCT: 36.3 % — ABNORMAL LOW (ref 40.0–52.0)
Hemoglobin: 12.3 g/dL — ABNORMAL LOW (ref 13.0–18.0)
MCH: 31.3 pg (ref 26.0–34.0)
MCHC: 33.9 g/dL (ref 32.0–36.0)
MCV: 92.2 fL (ref 80.0–100.0)
PLATELETS: 251 10*3/uL (ref 150–440)
RBC: 3.94 MIL/uL — AB (ref 4.40–5.90)
RDW: 13.6 % (ref 11.5–14.5)
WBC: 11.6 10*3/uL — ABNORMAL HIGH (ref 3.8–10.6)

## 2018-02-03 LAB — GLUCOSE, CAPILLARY
GLUCOSE-CAPILLARY: 184 mg/dL — AB (ref 70–99)
Glucose-Capillary: 139 mg/dL — ABNORMAL HIGH (ref 70–99)
Glucose-Capillary: 173 mg/dL — ABNORMAL HIGH (ref 70–99)
Glucose-Capillary: 190 mg/dL — ABNORMAL HIGH (ref 70–99)

## 2018-02-03 NOTE — Care Management Note (Signed)
Case Management Note  Patient Details  Name: Keith Holland MRN: [id number] Date of Birth: 02-11-1956  Subjective/Objective:  Met with patient and his wife at bedside to discuss discharge planning. Patient has a walker at home. He is being discharged home on ASA. Offered a list of home health agencies. Referral to [facility] for HHPT.  PCP is Dr. Raechel Ache.                    Action/Plan: [facility] for HHPT, ASA, No DME  Expected Discharge Date:                  Expected Discharge Plan:  [place]  In-House Referral:     Discharge planning Services  CM Consult  Post Acute Care Choice:  Home Health Choice offered to:  Patient, Spouse  DME Arranged:    DME Agency:     HH Arranged:  PT [place]:  [facility] (formerly [employer])  Status of Service:  In process, will continue to follow  If discussed at Long Length of Stay Meetings, dates discussed:    Additional Comments:  Jolly Mango, RN 02/03/2018, 3:54 PM

## 2018-02-03 NOTE — Progress Notes (Signed)
Clinical Social Worker (CSW) received SNF consult. PT is recommending home health. RN case manager aware of above. Please reconsult if future social work needs arise. CSW signing off.   Chesley Valls, LCSW [phone] 

## 2018-02-03 NOTE — Evaluation (Signed)
Occupational Therapy Evaluation Patient Details Name: Keith Holland MRN: [id number] DOB: May 17, 1956 Today's Date: 02/03/2018    History of Present Illness Pt. prestents to hospital status post TKR L with a PMH to include DM type 2, arthritis, prostate cancer, and vertigo.    Clinical Impression   Pt seen for OT evaluation this date, POD#1 from above surgery. Pt was independent in all ADLs prior to surgery, however experiencing increasing L knee pain and dysfunction which was impacting his mobility, LB ADL tasks, and work. Pt is eager to return to PLOF with less pain and improved safety and independence. Pt currently requires minimal assist for LB dressing and bathing while in seated position due to pain and limited AROM of L knee. Spouse able to provide this level of assist as needed. >23 minutes spent providing instruction to pt/spouse in polar care mgt, falls prevention strategies, home/routines modifications, DME/AE for LB bathing and dressing tasks, and compression stocking mgt. Pt/spouse verbalized understanding. Pt would benefit from skilled OT services including additional instruction in dressing techniques with or without assistive devices for dressing and bathing skills to support recall and carryover prior to discharge and ultimately to maximize safety, independence, and minimize falls risk and caregiver burden. Do not currently anticipate any OT needs following this hospitalization.       Follow Up Recommendations  No OT follow up    Equipment Recommendations  3 in 1 bedside commode    Recommendations for Other Services       Precautions / Restrictions Precautions Precautions: Knee Restrictions Weight Bearing Restrictions: Yes LLE Weight Bearing: Weight bearing as tolerated      Mobility Bed Mobility Overal bed mobility: Needs Assistance Bed Mobility: Supine to Sit     Supine to sit: Supervision     General bed mobility comments: no assist required this date,  increased time/effort with increased pain  Transfers Overall transfer level: Needs assistance Equipment used: Rolling walker (2 wheeled) Transfers: Sit to/from Stand Sit to Stand: Min guard              Balance Overall balance assessment: Needs assistance Sitting-balance support: No upper extremity supported;Feet supported Sitting balance-Leahy Scale: Normal     Standing balance support: Bilateral upper extremity supported Standing balance-Leahy Scale: Good                             ADL either performed or assessed with clinical judgement   ADL Overall ADL's : Needs assistance/impaired Eating/Feeding: Independent   Grooming: Independent   Upper Body Bathing: Sitting;Independent   Lower Body Bathing: Sit to/from stand;Minimal assistance;With caregiver independent assisting Lower Body Bathing Details (indicate cue type and reason): pt/spouse educated in use of BSC vs TTB vs shower chair Upper Body Dressing : Sitting   Lower Body Dressing: Sit to/from stand;Minimal assistance;With caregiver independent assisting Lower Body Dressing Details (indicate cue type and reason): pt/spouse instructed in AE for LB dressing to improve safety/independence. Instructed in compression stocking mgt. Toilet Transfer: RW;Min guard;Ambulation Toilet Transfer Details (indicate cue type and reason): Pt/spouse instructed in BSC frame over the commode to improve height and maximize safety and independence with toilet transfers upon return home         Functional mobility during ADLs: Min guard;Rolling walker       Vision Baseline Vision/History: Wears glasses Wears Glasses: Reading only Patient Visual Report: No change from baseline       Perception  Praxis      Pertinent Vitals/Pain Pain Assessment: 0-10 Pain Score: 6  Pain Location: L knee at rest, increasing initially to 8/10 with bed mobility before coming back down to 6/10 Pain Descriptors / Indicators:  Aching;Discomfort;Grimacing Pain Intervention(s): Limited activity within patient's tolerance;Monitored during session;Premedicated before session;Repositioned;Ice applied     Hand Dominance Right   Extremity/Trunk Assessment Upper Extremity Assessment Upper Extremity Assessment: Overall WFL for tasks assessed   Lower Extremity Assessment Lower Extremity Assessment: Defer to PT evaluation;LLE deficits/detail LLE Deficits / Details: expected post-op strength/ROM deficits    Cervical / Trunk Assessment Cervical / Trunk Assessment: Normal   Communication Communication Communication: No difficulties   Cognition Arousal/Alertness: Awake/alert Behavior During Therapy: WFL for tasks assessed/performed Overall Cognitive Status: Within Functional Limits for tasks assessed                                     General Comments       Exercises Other Exercises Other Exercises: Pt/spouse instructed in falls prevention strategies Other Exercises: Pt/spouse instructed in polar care mgt    Shoulder Instructions      Home Living Family/patient expects to be discharged to:: Private residence Living Arrangements: Spouse/significant other Available Help at Discharge: Family Type of Home: House Home Access: Stairs to enter Technical brewer of Steps: 4 or 6 Entrance Stairs-Rails: Left(left(4) Left or right(6)) Home Layout: One level     Bathroom Shower/Tub: Corporate investment banker: Standard Bathroom Accessibility: Yes   Home Equipment: Environmental consultant - 2 wheels;Walker - 4 wheels          Prior Functioning/Environment Level of Independence: Independent        Comments: Pt indep with mobility, ADL, and 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, lift, get on/off the floor, climb ladders, etc)        OT Problem List: Decreased knowledge of use of DME or AE;Decreased range of motion;Pain      OT  Treatment/Interventions: Self-care/ADL training;Therapeutic exercise;Therapeutic activities;DME and/or AE instruction;Patient/family education    OT Goals(Current goals can be found in the care plan section) Acute Rehab OT Goals Patient Stated Goal: return to PLOF with less L knee trouble OT Goal Formulation: With patient/family Time For Goal Achievement: 02/17/18 Potential to Achieve Goals: Good ADL Goals Pt Will Perform Lower Body Dressing: with supervision;sit to/from stand;with adaptive equipment;with caregiver independent in assisting Pt Will Transfer to Toilet: with supervision;ambulating(BSC over toilet, LRAD for amb) Additional ADL Goal #1: Pt will independently instruct spouse in compression stocking mgt Additional ADL Goal #2: Pt will independently instruct spouse in polar care mgt  OT Frequency: Min 1X/week   Barriers to D/C:            Co-evaluation              AM-PAC PT "6 Clicks" Daily Activity     Outcome Measure Help from another person eating meals?: None Help from another person taking care of personal grooming?: None Help from another person toileting, which includes using toliet, bedpan, or urinal?: A Little Help from another person bathing (including washing, rinsing, drying)?: A Little Help from another person to put on and taking off regular upper body clothing?: None Help from another person to put on and taking off regular lower body clothing?: A Little 6 Click Score: 21   End of Session Equipment Utilized During Treatment: Gait  belt;Rolling walker  Activity Tolerance: Patient tolerated treatment well Patient left: in chair;with call bell/phone within reach;with chair alarm set;with family/visitor present;Other (comment)(polar care and wound vac in place)  OT Visit Diagnosis: Other abnormalities of gait and mobility (R26.89);Pain Pain - Right/Left: Left Pain - part of body: Knee                Time: 0832-0907 OT Time Calculation (min): 35  min Charges:  OT General Charges $OT Visit: 1 Visit OT Evaluation $OT Eval Low Complexity: 1 Low OT Treatments $Self Care/Home Management : 23-37 mins   Jeni Salles, MPH, MS, OTR/L ascom [phone] 02/03/18, 10:03 AM

## 2018-02-03 NOTE — Progress Notes (Signed)
Physical Therapy Treatment Patient Details Name: Keith Holland MRN: [id number] DOB: 1956-07-04 Today's Date: 02/03/2018    History of Present Illness Pt. prestents to hospital status post TKR L with a PMH to include DM type 2, arthritis, prostate cancer, and vertigo.     PT Comments     Pt is progressing well towards goal. Pt was able to sit to stand with CGA for safety, and ambulate 200' with RW/CGA/chair follow/2 standing restbreaks. Bed mobility was preformed at end of treatment secondary to pt being found in recliner, and pt required supervision for sit to supine. Pt denied any dizziness during treatment, and RPE was assessed throughout(mild). D/C recommendations continue to remain appropriate at this time.  Follow Up Recommendations  Home health PT     Equipment Recommendations  3in1 (PT)    Recommendations for Other Services       Precautions / Restrictions Precautions Precautions: Knee Precaution Booklet Issued: Yes (comment) Restrictions Weight Bearing Restrictions: Yes LLE Weight Bearing: Weight bearing as tolerated    Mobility  Bed Mobility Overal bed mobility: Needs Assistance Bed Mobility: Sit to Supine       Sit to supine: Supervision   General bed mobility comments: No physical assist required to return to bed.  Transfers Overall transfer level: Needs assistance Equipment used: Rolling walker (2 wheeled) Transfers: Sit to/from Stand Sit to Stand: Min guard         General transfer comment: pt required CGA for safety, will likely progress to supervision tomorrow  Ambulation/Gait Ambulation/Gait assistance: Min guard Gait Distance (Feet): 200 Feet Assistive device: Rolling walker (2 wheeled) Gait Pattern/deviations: Decreased step length - right Gait velocity: 10 feet in 6 seconds Gait velocity interpretation: 1.31 - 2.62 ft/sec, indicative of limited community ambulator General Gait Details: pt ambulated 200' with 2 standing rest breaks and  chair follow with PT CGA for safety. Pt is progressing R leg further during swingthrough to normailize gait. PT monitored RPE(mild) during gait.   Stairs             Wheelchair Mobility    Modified Rankin (Stroke Patients Only)       Balance Overall balance assessment: Needs assistance Sitting-balance support: No upper extremity supported;Feet supported Sitting balance-Leahy Scale: Normal     Standing balance support: Bilateral upper extremity supported Standing balance-Leahy Scale: Good                              Cognition Arousal/Alertness: Awake/alert Behavior During Therapy: WFL for tasks assessed/performed Overall Cognitive Status: Within Functional Limits for tasks assessed                                        Exercises Total Joint Exercises Goniometric ROM: L Knee AAROM 5-100 degrees Other Exercises Other Exercises: Pt instructed in seated there.ex to include ankle pumps x12, glute sets x12, B hip flexor marhcing x10, and LAQ x12. Supine there.ex included quad sets x12, heel slides x10, and hip abd/add x12.    General Comments        Pertinent Vitals/Pain Pain Assessment: 0-10 Pain Score: 4  Pain Location: L knee  Pain Descriptors / Indicators: Aching;Discomfort;Grimacing Pain Intervention(s): Limited activity within patient's tolerance;Monitored during session;RN gave pain meds during session;Repositioned;Ice applied    Home Living  Prior Function            PT Goals (current goals can now be found in the care plan section) Acute Rehab PT Goals Patient Stated Goal: return to PLOF with less L knee trouble PT Goal Formulation: With patient Time For Goal Achievement: 02/16/18 Potential to Achieve Goals: Good Progress towards PT goals: Progressing toward goals    Frequency    BID      PT Plan Current plan remains appropriate    Co-evaluation              AM-PAC PT "6  Clicks" Daily Activity  Outcome Measure  Difficulty turning over in bed (including adjusting bedclothes, sheets and blankets)?: Unable Difficulty moving from lying on back to sitting on the side of the bed? : A Little Difficulty sitting down on and standing up from a chair with arms (e.g., wheelchair, bedside commode, etc,.)?: Unable Help needed moving to and from a bed to chair (including a wheelchair)?: A Little Help needed walking in hospital room?: A Little Help needed climbing 3-5 steps with a railing? : A Lot 6 Click Score: 13    End of Session Equipment Utilized During Treatment: Gait belt Activity Tolerance: Patient tolerated treatment well Patient left: with call bell/phone within reach;with family/visitor present;in bed;with bed alarm set;with SCD's reapplied Nurse Communication: Mobility status PT Visit Diagnosis: Unsteadiness on feet (R26.81);Other abnormalities of gait and mobility (R26.89);Muscle weakness (generalized) (M62.81);Pain;Difficulty in walking, not elsewhere classified (R26.2) Pain - Right/Left: Left Pain - part of body: Knee     Time: 1359-1430 PT Time Calculation (min) (ACUTE ONLY): 31 min  Charges:  $Gait Training: 8-22 mins $Therapeutic Exercise: 8-22 mins                    G Codes:       Rosario Adie, SPT    Rosario Adie 02/03/2018, 2:56 PM

## 2018-02-03 NOTE — Progress Notes (Signed)
Physical Therapy Treatment Patient Details Name: Keith Holland MRN: [id number] DOB: 12/13/1955 Today's Date: 02/03/2018    History of Present Illness Pt. prestents to hospital status post TKR L with a PMH to include DM type 2, arthritis, prostate cancer, and vertigo.     PT Comments    Pt is progressing well towards goal, and PT will reassess in afternoon. Pt was able to sit to stand with CGA for safety secondary to dizziness, and ambulate 100' with RW/CGA/chair follow. Bed mobility deferred secondary to pt being found in recliner and pt preference to return to recliner, so PT will assess in afternoon. PT monitored O2 and HR throughout treatment secondary to dizziness which remained WFL. D/C recommendations continue to remain appropriate at this time.  Follow Up Recommendations  Home health PT     Equipment Recommendations  3in1 (PT)    Recommendations for Other Services       Precautions / Restrictions Precautions Precautions: Knee Precaution Booklet Issued: Yes (comment) Restrictions Weight Bearing Restrictions: Yes LLE Weight Bearing: Weight bearing as tolerated    Mobility  Bed Mobility Overal bed mobility: (deffered) Bed Mobility: Supine to Sit     Supine to sit: Supervision     General bed mobility comments: deffered because pt found in recliner and wanted to return to recliner. PT will assess in afternoon.  Transfers Overall transfer level: Needs assistance Equipment used: Rolling walker (2 wheeled) Transfers: Sit to/from Stand Sit to Stand: Min guard         General transfer comment: pt required CGA secondary to diziness  Ambulation/Gait Ambulation/Gait assistance: Min guard Gait Distance (Feet): 100 Feet Assistive device: Rolling walker (2 wheeled) Gait Pattern/deviations: Decreased step length - right     General Gait Details: Pt ambulated 100' with PT CGA for safety, and verbal cueing to normailze gait to step through pattern. R leg slightly  passing L with gait. O2 and HR monitored throughout treatment and remianed in [facility].   Stairs             Wheelchair Mobility    Modified Rankin (Stroke Patients Only)       Balance Overall balance assessment: Needs assistance Sitting-balance support: No upper extremity supported;Feet supported Sitting balance-Leahy Scale: Normal     Standing balance support: Bilateral upper extremity supported Standing balance-Leahy Scale: Good                              Cognition Arousal/Alertness: Awake/alert Behavior During Therapy: WFL for tasks assessed/performed Overall Cognitive Status: Within Functional Limits for tasks assessed                                        Exercises Total Joint Exercises Goniometric ROM: L Knee AAROM 5-100 degrees Other Exercises Other Exercises: Pt instructed in seated there.ex to include ankle pumps x12, glute sets x10, PT resisted hip abd x10, and LAQ x10 all on the L LE Other Exercises: Pt/spouse instructed in falls prevention strategies Other Exercises: Pt/spouse instructed in polar care mgt     General Comments        Pertinent Vitals/Pain Pain Assessment: 0-10 Pain Score: 5  Pain Location: L knee  Pain Descriptors / Indicators: Aching;Discomfort;Grimacing Pain Intervention(s): Limited activity within patient's tolerance;Monitored during session;Repositioned;Ice applied    Home Living Family/patient expects to be discharged to:: Private residence  Living Arrangements: Spouse/significant other Available Help at Discharge: Family Type of Home: House Home Access: Stairs to enter Entrance Stairs-Rails: Left(left(4) Left or right(6)) Home Layout: One level Home Equipment: Walker - 2 wheels;Walker - 4 wheels      Prior Function Level of Independence: Independent      Comments: Pt indep with mobility, ADL, and 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, lift, get on/off  the floor, climb ladders, etc)   PT Goals (current goals can now be found in the care plan section) Acute Rehab PT Goals Patient Stated Goal: return to PLOF with less L knee trouble PT Goal Formulation: With patient Time For Goal Achievement: 02/16/18 Potential to Achieve Goals: Good Progress towards PT goals: Progressing toward goals    Frequency    BID      PT Plan Current plan remains appropriate    Co-evaluation              AM-PAC PT "6 Clicks" Daily Activity  Outcome Measure  Difficulty turning over in bed (including adjusting bedclothes, sheets and blankets)?: Unable Difficulty moving from lying on back to sitting on the side of the bed? : Unable Difficulty sitting down on and standing up from a chair with arms (e.g., wheelchair, bedside commode, etc,.)?: Unable Help needed moving to and from a bed to chair (including a wheelchair)?: A Little Help needed walking in hospital room?: A Little Help needed climbing 3-5 steps with a railing? : A Lot 6 Click Score: 11    End of Session Equipment Utilized During Treatment: Gait belt Activity Tolerance: Patient tolerated treatment well Patient left: in chair;with call bell/phone within reach;with chair alarm set;with family/visitor present Nurse Communication: Mobility status PT Visit Diagnosis: Unsteadiness on feet (R26.81);Other abnormalities of gait and mobility (R26.89);Muscle weakness (generalized) (M62.81);Pain;Difficulty in walking, not elsewhere classified (R26.2) Pain - Right/Left: Left Pain - part of body: Knee     Time: 1005-1035 PT Time Calculation (min) (ACUTE ONLY): 30 min  Charges:                       G Codes:       Keith Holland, SPT    Keith Holland 02/03/2018, 1:32 PM

## 2018-02-03 NOTE — Progress Notes (Signed)
   Subjective: 1 Day Post-Op Procedure(s) (LRB): TOTAL KNEE ARTHROPLASTY (Left) Patient reports pain as 5 on 0-10 scale.   Patient is well, and has had no acute complaints or problems Denies any CP, SOB, ABD pain. We will continue therapy today.  Plan is to go Home after hospital stay.  Objective: Vital signs in last 24 hours: Temp:  [97.2 F (36.2 C)-97.6 F (36.4 C)] 97.6 F (36.4 C) (07/24 0810) Pulse Rate:  [55-77] 59 (07/24 0810) Resp:  [11-20] 16 (07/24 0404) BP: (104-137)/(64-87) 105/65 (07/24 0810) SpO2:  [94 %-98 %] 96 % (07/24 0810)  Intake/Output from previous day: 07/23 0701 - 07/24 0700 In: 3060 [P.O.:530; I.V.:1530; IV Piggyback:100] Out: 6948 [Urine:3225; Blood:100] Intake/Output this shift: No intake/output data recorded.  Recent Labs    02/03/18 0440  HGB 12.3*   Recent Labs    02/03/18 0440  WBC 11.6*  RBC 3.94*  HCT 36.3*  PLT 251   Recent Labs    02/03/18 0440  NA 140  K 4.3  CL 110  CO2 26  BUN 12  CREATININE 0.66  GLUCOSE 161*  CALCIUM 8.4*   No results for input(s): LABPT, INR in the last 72 hours.  EXAM General - Patient is Alert, Appropriate and Oriented Extremity - Neurovascular intact Sensation intact distally Intact pulses distally Dorsiflexion/Plantar flexion intact No cellulitis present Compartment soft Dressing - dressing C/D/I and no drainage, wound vac intact with out drainage Motor Function - intact, moving foot and toes well on exam.   Past Medical History:  Diagnosis Date  . Arthritis   . Cancer [facility]) 2018   Prostate- December 30, 2016 removed  . Complication of anesthesia    severe n/v  . Diabetes mellitus without complication ([place])   . GERD (gastroesophageal reflux disease)   . Hyperlipidemia   . PONV (postoperative nausea and vomiting)   . SAH (subarachnoid hemorrhage) ([place])    spontaneous 2000 n surgery  . Vertigo    since childhood if on back long will get vertigo     Assessment/Plan:   1 Day  Post-Op Procedure(s) (LRB): TOTAL KNEE ARTHROPLASTY (Left) Active Problems:   Status post total knee replacement using cement, left  Estimated body mass index is 31.96 kg/m as calculated from the following:   Height as of this encounter: 5\' 6"  (1.676 m).   Weight as of this encounter: 89.8 kg (198 lb). Advance diet Up with therapy  Needs BM Labs and vital signs stable CM to assist with discharge   DVT Prophylaxis - Aspirin, TED hose and SCD Weight-Bearing as tolerated to left leg   T. Rachelle Hora, PA-C [place] 02/03/2018, 8:20 AM

## 2018-02-04 LAB — GLUCOSE, CAPILLARY
GLUCOSE-CAPILLARY: 136 mg/dL — AB (ref 70–99)
Glucose-Capillary: 164 mg/dL — ABNORMAL HIGH (ref 70–99)
Glucose-Capillary: 149 mg/dL — ABNORMAL HIGH (ref 70–99)
Glucose-Capillary: 216 mg/dL — ABNORMAL HIGH (ref 70–99)
Glucose-Capillary: 147 mg/dL — ABNORMAL HIGH (ref 70–99)

## 2018-02-04 LAB — CBC
HCT: 33.9 % — ABNORMAL LOW (ref 40.0–52.0)
HEMOGLOBIN: 11.6 g/dL — AB (ref 13.0–18.0)
MCH: 31.4 pg (ref 26.0–34.0)
MCHC: 34.1 g/dL (ref 32.0–36.0)
MCV: 92 fL (ref 80.0–100.0)
PLATELETS: 227 10*3/uL (ref 150–440)
RBC: 3.69 MIL/uL — AB (ref 4.40–5.90)
RDW: 13.4 % (ref 11.5–14.5)
WBC: 7.8 10*3/uL (ref 3.8–10.6)

## 2018-02-04 LAB — BASIC METABOLIC PANEL
Anion gap: 6 (ref 5–15)
BUN: 17 mg/dL (ref 8–23)
CHLORIDE: 102 mmol/L (ref 98–111)
CO2: 28 mmol/L (ref 22–32)
CREATININE: 0.72 mg/dL (ref 0.61–1.24)
Calcium: 8.1 mg/dL — ABNORMAL LOW (ref 8.9–10.3)
GFR calc non Af Amer: >60 mL/min (ref >60)
Glucose, Bld: 177 mg/dL — ABNORMAL HIGH (ref 70–99)
Potassium: 4.2 mmol/L (ref 3.5–5.1)
SODIUM: 136 mmol/L (ref 135–145)

## 2018-02-04 MED ORDER — ASPIRIN 325 MG PO TBEC: 325.0000 mg | DELAYED_RELEASE_TABLET | Freq: Every day | ORAL | 0 refills | Status: DC

## 2018-02-04 MED ORDER — BISACODYL 10 MG RE SUPP
10.0000 mg | Freq: Every day | RECTAL | Status: DC | PRN
  Administered 2018-02-05: 10 mg via RECTAL
  Filled 2018-02-04: qty 1

## 2018-02-04 MED ORDER — PROMETHAZINE HCL 25 MG/ML IJ SOLN
12.5000 mg | Freq: Once | INTRAMUSCULAR | Status: AC
  Administered 2018-02-04: 12.5 mg via INTRAVENOUS
  Filled 2018-02-04: qty 1

## 2018-02-04 MED ORDER — SODIUM CHLORIDE 0.9 % IV BOLUS
500.0000 mL | Freq: Once | INTRAVENOUS | Status: AC
  Administered 2018-02-04: 500 mL via INTRAVENOUS

## 2018-02-04 MED ORDER — TRAMADOL HCL 50 MG PO TABS: 50.0000 mg | ORAL_TABLET | Freq: Four times a day (QID) | ORAL | 0 refills | Status: DC | PRN

## 2018-02-04 MED ORDER — MAGNESIUM HYDROXIDE 400 MG/5ML PO SUSP
30.0000 mL | Freq: Once | ORAL | Status: AC
  Administered 2018-02-04: 30 mL via ORAL
  Filled 2018-02-04: qty 30

## 2018-02-04 MED ORDER — HYDROMORPHONE HCL 2 MG PO TABS: 1.0000 mg | ORAL_TABLET | ORAL | 0 refills | Status: DC | PRN

## 2018-02-04 NOTE — Care Management (Signed)
[facility] unable to accept patient because patient in [facility]. Referral to [facility] made. Spoke with wife who is agreeable with POC.

## 2018-02-04 NOTE — Progress Notes (Signed)
OT Cancellation Note  Patient Details Name: Keith Holland MRN: [id number] DOB: 1955-10-03   Cancelled Treatment:    Reason Eval/Treat Not Completed: Other (comment). Upon attempt this am, pt sleeping soundly. Wife in room reports pt had fever last night and has increased pain this am. Per wife's request, OT will re-attempt this afternoon.   Jeni Salles, MPH, MS, OTR/L ascom [phone] 02/04/18, 10:03 AM

## 2018-02-04 NOTE — Discharge Summary (Signed)
Physician Discharge Summary  Patient ID: Keith Holland MRN: [id number] DOB/AGE: 08/24/1955 62 y.o.  Admit date: 02/02/2018 Discharge date: 02/05/2018  Admission Diagnoses:  secondary osteoarthritis of left knee   Discharge Diagnoses: Patient Active Problem List   Diagnosis Date Noted  . 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 Prostate cancer ([place]) 11/30/2016  . Elevated PSA 10/13/2016    Past Medical History:  Diagnosis Date  . Arthritis   . Cancer [facility]) 2018   Prostate- December 30, 2016 removed  . Complication of anesthesia    severe n/v  . Diabetes mellitus without complication ([place])   . GERD (gastroesophageal reflux disease)   . Hyperlipidemia   . PONV (postoperative nausea and vomiting)   . SAH (subarachnoid hemorrhage) ([place])    spontaneous 2000 n surgery  . Vertigo    since childhood if on back long will get vertigo      Transfusion: none   Consultants (if any):   Discharged Condition: Improved  Hospital Course: HERSHALL BENKERT is an 62 y.o. male who was admitted 02/02/2018 with a diagnosis of right knee osteoarthritis and went to the operating room on 02/02/2018 and underwent the above named procedures.    Surgeries: Procedure(s): TOTAL KNEE ARTHROPLASTY on 02/02/2018 Patient tolerated the surgery well. Taken to PACU where she was stabilized and then transferred to the orthopedic floor.  Started on Aspirin 325 mg with SCDs. Heels elevated on bed with rolled towels. No evidence of DVT. Negative [place]. Physical therapy started on day #1 for gait training and transfer. OT started day #1 for ADL and assisted devices.  Patient's foley was d/c on day #1. Patient's IV was d/c on day #2.  On post op day #3 patient was stable and ready for discharge to home with HHPT.  Implants: Medacta left GMK Sphere 5 left femur, 4 left tibia with 10 mm insert short stem and size 3 patella, all components cemented    He was given perioperative  antibiotics:  Anti-infectives (From admission, onward)   Start     Dose/Rate Route Frequency Ordered Stop   02/02/18 1330  ceFAZolin (ANCEF) IVPB 2g/100 mL premix     2 g 200 mL/hr over 30 Minutes Intravenous Every 6 hours 02/02/18 1126 02/03/18 0411   02/02/18 0558  ceFAZolin (ANCEF) 2-4 GM/100ML-% IVPB    Note to Pharmacy:  [place]: cabinet override      02/02/18 0558 02/02/18 0733   02/01/18 2215  ceFAZolin (ANCEF) IVPB 2g/100 mL premix     2 g 200 mL/hr over 30 Minutes Intravenous  Once 02/01/18 2214 02/02/18 0733    .  He was given sequential compression devices, early ambulation, and Aspirin for DVT prophylaxis.  He benefited maximally from the hospital stay and there were no complications.    Recent vital signs:  Vitals:   02/05/18 0027 02/05/18 0828  BP: 129/72 129/82  Pulse: 98 82  Resp: 20 18  Temp: 100 F (37.8 C) 99 F (37.2 C)  SpO2: 95% 96%    Recent laboratory studies:  Lab Results  Component Value Date   HGB 11.6 (L) 02/04/2018   HGB 12.3 (L) 02/03/2018   HGB 13.3 01/20/2018   Lab Results  Component Value Date   WBC 7.8 02/04/2018   PLT 227 02/04/2018   Lab Results  Component Value Date   INR 0.97 01/20/2018   Lab Results  Component Value Date   NA 136 02/04/2018   K  4.2 02/04/2018   CL 102 02/04/2018   CO2 28 02/04/2018   BUN 17 02/04/2018   CREATININE 0.72 02/04/2018   GLUCOSE 177 (H) 02/04/2018    Discharge Medications:   Allergies as of 02/05/2018      Reactions   Percocet [oxycodone-acetaminophen] Nausea And Vomiting   Vicodin [hydrocodone-acetaminophen] Nausea And Vomiting, Other (See Comments)   Can tolerate with Phenergan      Medication List    STOP taking these medications   naproxen sodium 220 MG tablet Commonly known as:  ALEVE     TAKE these medications   acetaminophen 500 MG tablet Commonly known as:  TYLENOL Take 1-2 tablets (500-1,000 mg total) by mouth every 6 (six) hours as needed for mild pain  or fever (pain score 1-3 or temp > 100.5).   aspirin 325 MG EC tablet Take 1 tablet (325 mg total) by mouth daily with breakfast.   bisacodyl 5 MG EC tablet Commonly known as:  DULCOLAX Take 1 tablet (5 mg total) by mouth daily as needed for moderate constipation.   HYDROmorphone 2 MG tablet Commonly known as:  DILAUDID Take 0.5 tablets (1 mg total) by mouth every 4 (four) hours as needed for moderate pain or severe pain.   metFORMIN 500 MG 24 hr tablet Commonly known as:  GLUCOPHAGE-XR Take 1,000 mg by mouth at bedtime.   omeprazole 20 MG capsule Commonly known as:  PRILOSEC Take 20 mg by mouth daily.   pioglitazone 30 MG tablet Commonly known as:  ACTOS Take 30 mg by mouth every evening.   sildenafil 20 MG tablet Commonly known as:  REVATIO Take 1 tablet (20 mg total) by mouth as needed. Take 1-5 tabs as needed prior to intercourse What changed:    how much to take  reasons to take this  additional instructions   simvastatin 40 MG tablet Commonly known as:  ZOCOR Take 40 mg by mouth every evening.   traMADol 50 MG tablet Commonly known as:  ULTRAM Take 1 tablet (50 mg total) by mouth every 6 (six) hours as needed.   TRESIBA FLEXTOUCH 200 UNIT/ML Sopn Generic drug:  Insulin Degludec Inject 20 Units into the skin at bedtime.            Durable Medical Equipment  (From admission, onward)        Start     Ordered   02/02/18 1127  DME Walker rolling  Once    Question:  Patient needs a walker to treat with the following condition  Answer:  Status post total knee replacement using cement, left   02/02/18 1126   02/02/18 1127  DME 3 n 1  Once     02/02/18 1126   02/02/18 1127  DME Bedside commode  Once    Question:  Patient needs a bedside commode to treat with the following condition  Answer:  Status post total knee replacement using cement, left   02/02/18 1126      Diagnostic Studies: Dg Chest 2 View  Result Date: 02/05/2018 CLINICAL DATA:  Fever  EXAM: CHEST - 2 VIEW COMPARISON:  None. FINDINGS: Normal heart size. Lungs clear. No pneumothorax. No pleural effusion. IMPRESSION: No active cardiopulmonary disease. Electronically Signed   By: Marybelle Killings M.D.   On: 02/05/2018 09:03   Dg Knee 1-2 Views Left  Result Date: 02/02/2018 CLINICAL DATA:  Status post total knee replacement EXAM: LEFT KNEE - 1-2 VIEW COMPARISON:  Knee MRI January 06, 2018 FINDINGS: Frontal and  lateral views were obtained. Patient is status post total knee replacement with femoral and tibial prosthetic components well-seated. No fracture or dislocation. Air within the joint is an expected postoperative finding. No erosive change. IMPRESSION: Total knee replacement with femoral and tibial prosthetic components well-seated. No fracture or dislocation. Soft tissue air, consistent with postoperative change. Electronically Signed   By: Lowella Grip III M.D.   On: 02/02/2018 09:49   Mr Knee Left Wo Contrast  Result Date: 01/07/2018 CLINICAL DATA:  Osteoarthritis, pain EXAM: MRI OF THE LEFT KNEE WITHOUT CONTRAST TECHNIQUE: Multiplanar, multisequence MR imaging of the knee was performed. No intravenous contrast was administered. COMPARISON:  None. FINDINGS: MENISCI Medial meniscus: Complex tear of the posterior horn of the medial meniscus. Radial tear of the free edge of the body of medial meniscus. Lateral meniscus:  Intact. LIGAMENTS Cruciates: Intact ACL and PCL. ACL is increased in signal and expanded consistent with mucinous degeneration. Collaterals: Medial collateral ligament is intact. Lateral collateral ligament complex is intact. CARTILAGE Patellofemoral: High-grade partial-thickness cartilage loss of the medial patellofemoral compartment. Medial: High-grade partial-thickness cartilage loss with areas of full-thickness cartilage loss of the medial femorotibial compartment. Lateral: Partial-thickness cartilage loss of the lateral femoral condyle. Cartilage irregularity of the  lateral tibial plateau. Small marginal osteophytes. Joint: Small joint effusion. Mild edema in Hoffa's fat. No plical thickening. Popliteal Fossa:  No Baker cyst. Intact popliteus tendon. Extensor Mechanism: Intact quadriceps tendon. Intact patellar tendon. Intact medial patellar retinaculum. Intact lateral patellar retinaculum. Intact MPFL. Bones:  No acute osseous abnormality.  No aggressive osseous lesion. Other: No fluid collection or hematoma.  Muscles are normal. IMPRESSION: 1. Complex tear of the posterior horn of the medial meniscus. Radial tear of the free edge of the body of medial meniscus. 2. Tricompartmental cartilage abnormalities as described above most severe in the medial femorotibial compartment. Severe osteoarthritis of the medial femorotibial compartment. 3. Intact ACL with mucinous degeneration. Electronically Signed   By: Kathreen Devoid   On: 01/07/2018 08:55    Disposition: Discharge disposition: 01-Home or Self Care         Follow-up Information    Duanne Guess, PA-C Follow up in 2 week(s).   Specialties:  Orthopedic Surgery, Emergency Medicine Contact information: [place] [address] [phone]            Signed: Feliberto Gottron 02/05/2018, 11:53 AM

## 2018-02-04 NOTE — Progress Notes (Signed)
Physical Therapy Treatment Patient Details Name: Keith Holland MRN: [id number] DOB: 1956-04-15 Today's Date: 02/04/2018    History of Present Illness Pt. prestents to hospital status post TKR L with a PMH to include DM type 2, arthritis, prostate cancer, and vertigo.     PT Comments    Pt is progressing well towards goal. Pt was able to perform bed mobility with PT Min A at lower extremity due to pain, sit to stand with Min A for safety, ambulate 150' with RW/CGA/chair follow/1 standing rest break/1 seated rest break, and navigate 4 stairs with B UE support on L rail and PT Min A at gait belt/LE. Deficits were secondary to increased pain, dizziness, and fatigue. Functional mobility should improve when the pain and dizziness are managed. D/C recommendations continue to remain appropriate at this time.   Follow Up Recommendations  Home health PT     Equipment Recommendations  3in1 (PT)    Recommendations for Other Services       Precautions / Restrictions Precautions Precautions: Knee Precaution Booklet Issued: Yes (comment) Restrictions Weight Bearing Restrictions: Yes LLE Weight Bearing: Weight bearing as tolerated    Mobility  Bed Mobility Overal bed mobility: Needs Assistance Bed Mobility: Sit to Supine     Supine to sit: Min assist     General bed mobility comments: PT required Min A at L LE secondary to pain  Transfers Overall transfer level: Needs assistance Equipment used: Rolling walker (2 wheeled) Transfers: Sit to/from Stand Sit to Stand: Min assist         General transfer comment: Pt needed Min A at gait belt for unsteadiness secondary to pain.  Ambulation/Gait Ambulation/Gait assistance: Min guard Gait Distance (Feet): 150 Feet Assistive device: Rolling walker (2 wheeled) Gait Pattern/deviations: Decreased step length - right Gait velocity: 10 feet in 10 sec Gait velocity interpretation: <1.31 ft/sec, indicative of household ambulator General  Gait Details: pt ambulated 150' with 1 standing rest break and 1 seated rest break. A chair follow was utilized with PT CGA for safety. Gait was diminished secondary to increased pain.   Stairs Stairs: Yes Stairs assistance: Min assist Stair Management: One rail Left;Step to pattern Number of Stairs: 4 General stair comments: Pt required Min A at gait belt/L knee and B UE support on hand rail with step to pattern. Pt benefited from frequent verbal cueing for safe navigation of stairs. Pt had difficulty secondary to increased pain, fatigue, and dizziness. Pt attempted Bowl movemt at end of session, but was unsucsessful.   Wheelchair Mobility    Modified Rankin (Stroke Patients Only)       Balance Overall balance assessment: Needs assistance Sitting-balance support: No upper extremity supported;Feet supported Sitting balance-Leahy Scale: Normal     Standing balance support: Bilateral upper extremity supported Standing balance-Leahy Scale: Good                              Cognition Arousal/Alertness: Awake/alert Behavior During Therapy: WFL for tasks assessed/performed Overall Cognitive Status: Within Functional Limits for tasks assessed                                        Exercises Total Joint Exercises Goniometric ROM: L knee AAROM 6-88 deg Other Exercises Other Exercises: pt instructed in supine there.ex included quad/glute sets x12, Min A heel slides x10, and  Min A hip abd/add x12. Pt instructed in seated there.ex to include LAQ x10 and repeated flexion stretch x10.    General Comments        Pertinent Vitals/Pain Pain Assessment: 0-10 Pain Score: 5  Pain Location: L knee  Pain Descriptors / Indicators: Discomfort;Grimacing Pain Intervention(s): Limited activity within patient's tolerance;Monitored during session;Repositioned;Patient requesting pain meds-RN notified;Ice applied    Home Living                      Prior  Function            PT Goals (current goals can now be found in the care plan section) Acute Rehab PT Goals Patient Stated Goal: return to PLOF with less L knee trouble PT Goal Formulation: With patient Time For Goal Achievement: 02/16/18 Potential to Achieve Goals: Good Progress towards PT goals: Progressing toward goals    Frequency    BID      PT Plan Current plan remains appropriate    Co-evaluation              AM-PAC PT "6 Clicks" Daily Activity  Outcome Measure  Difficulty turning over in bed (including adjusting bedclothes, sheets and blankets)?: Unable Difficulty moving from lying on back to sitting on the side of the bed? : Unable Difficulty sitting down on and standing up from a chair with arms (e.g., wheelchair, bedside commode, etc,.)?: Unable Help needed moving to and from a bed to chair (including a wheelchair)?: A Little Help needed walking in hospital room?: A Little Help needed climbing 3-5 steps with a railing? : A Lot 6 Click Score: 11    End of Session Equipment Utilized During Treatment: Gait belt Activity Tolerance: Patient limited by fatigue;Patient limited by pain;Patient limited by lethargy(increased dizziness) Patient left: in chair;with call bell/phone within reach;with chair alarm set;with nursing/sitter in room;with family/visitor present Nurse Communication: Mobility status PT Visit Diagnosis: Unsteadiness on feet (R26.81);Other abnormalities of gait and mobility (R26.89);Muscle weakness (generalized) (M62.81);Pain;Difficulty in walking, not elsewhere classified (R26.2) Pain - Right/Left: Left Pain - part of body: Knee     Time: 4680-3212 PT Time Calculation (min) (ACUTE ONLY): 53 min  Charges:                        Rosario Adie, SPT   Rosario Adie 02/04/2018, 4:39 PM

## 2018-02-04 NOTE — Progress Notes (Signed)
PT Cancellation Note  Patient Details Name: Keith Holland MRN: [id number] DOB: July 11, 1956   Cancelled Treatment:     Treatment attempted this AM 3x and held secondary to pain and breakfast and then lethargy/sleepy. Wife stated pt did not sleep well last night and had increased pain and headache. PT will reattempt in PM.   Rosario Adie 02/04/2018, 10:44 AM

## 2018-02-04 NOTE — Progress Notes (Signed)
   Subjective: 2 Days Post-Op Procedure(s) (LRB): TOTAL KNEE ARTHROPLASTY (Left) Patient reports pain as moderate.  Increased knee pain today compared to yesterday. Patient is well, but has had some minor complaints of gradual onset HA. Denies any CP, SOB, ABD pain. Low grade fever 99.6 We will continue therapy today.  Plan is to go Home after hospital stay.  Objective: Vital signs in last 24 hours: Temp:  [97.5 F (36.4 C)-99.6 F (37.6 C)] 99.6 F (37.6 C) (07/25 0848) Pulse Rate:  [56-84] 84 (07/25 0848) Resp:  [16-20] 20 (07/25 0848) BP: (104-117)/(59-70) 105/59 (07/25 0848) SpO2:  [88 %-99 %] 90 % (07/25 0848)  Intake/Output from previous day: 07/24 0701 - 07/25 0700 In: -  Out: 150 [Urine:150] Intake/Output this shift: No intake/output data recorded.  Recent Labs    02/03/18 0440  HGB 12.3*   Recent Labs    02/03/18 0440  WBC 11.6*  RBC 3.94*  HCT 36.3*  PLT 251   Recent Labs    02/03/18 0440  NA 140  K 4.3  CL 110  CO2 26  BUN 12  CREATININE 0.66  GLUCOSE 161*  CALCIUM 8.4*   No results for input(s): LABPT, INR in the last 72 hours.  EXAM General - Patient is Alert, Appropriate and Oriented Extremity - Neurovascular intact Sensation intact distally Intact pulses distally Dorsiflexion/Plantar flexion intact No cellulitis present Compartment soft  Patella tracking well Unable to SLR CN II-XII intact Dressing - dressing C/D/I and no drainage, wound vac intact with out drainage Motor Function - intact, moving foot and toes well on exam.   Past Medical History:  Diagnosis Date  . Arthritis   . Cancer [facility]) 2018   Prostate- December 30, 2016 removed  . Complication of anesthesia    severe n/v  . Diabetes mellitus without complication ([place])   . GERD (gastroesophageal reflux disease)   . Hyperlipidemia   . PONV (postoperative nausea and vomiting)   . SAH (subarachnoid hemorrhage) ([place])    spontaneous 2000 n surgery  . Vertigo    since  childhood if on back long will get vertigo     Assessment/Plan:   2 Days Post-Op Procedure(s) (LRB): TOTAL KNEE ARTHROPLASTY (Left) Active Problems:   Status post total knee replacement using cement, left  Estimated body mass index is 31.96 kg/m as calculated from the following:   Height as of this encounter: 5\' 6"  (1.676 m).   Weight as of this encounter: 89.8 kg (198 lb). Advance diet Up with therapy  Needs BM VSS, low grade fever of 99.6. Encouraged incentive spirometer Labs pending this am Headache - no neuro deficit, gradual onset. Will give IV fluids and IV phenergan. Recheck after CM to assist with discharge to home with HHPT   DVT Prophylaxis - Aspirin, TED hose and SCD Weight-Bearing as tolerated to left leg   T. Rachelle Hora, PA-C [place] 02/04/2018, 9:03 AM

## 2018-02-04 NOTE — Discharge Instructions (Signed)

## 2018-02-05 ENCOUNTER — Inpatient Hospital Stay: Payer: BC Managed Care – PPO

## 2018-02-05 LAB — URINALYSIS, COMPLETE (UACMP) WITH MICROSCOPIC
BACTERIA UA: NONE SEEN
BILIRUBIN URINE: NEGATIVE
Glucose, UA: 50 mg/dL — AB
Hgb urine dipstick: NEGATIVE
KETONES UR: 5 mg/dL — AB
LEUKOCYTES UA: NEGATIVE
NITRITE: NEGATIVE
PROTEIN: NEGATIVE mg/dL
SQUAMOUS EPITHELIAL / LPF: NONE SEEN (ref 0–5)
Specific Gravity, Urine: 1.01 (ref 1.005–1.030)
pH: 7 (ref 5.0–8.0)

## 2018-02-05 LAB — GLUCOSE, CAPILLARY
GLUCOSE-CAPILLARY: 111 mg/dL — AB (ref 70–99)
Glucose-Capillary: 149 mg/dL — ABNORMAL HIGH (ref 70–99)

## 2018-02-05 MED ORDER — ACETAMINOPHEN 500 MG PO TABS: 500.0000 mg | ORAL_TABLET | Freq: Four times a day (QID) | ORAL | Status: DC | PRN

## 2018-02-05 MED ORDER — BISACODYL 5 MG PO TBEC: 5.0000 mg | DELAYED_RELEASE_TABLET | Freq: Every day | ORAL | 0 refills | Status: DC | PRN

## 2018-02-05 NOTE — Care Management (Signed)
Discharge to home today per Dr. Rudene Christians. Will be followed by [facility] in the home. Floydene Flock, [facility] representative updated. Shelbie Ammons RN MSN CCM Care Management [phone]

## 2018-02-05 NOTE — Progress Notes (Signed)
Physical Therapy Treatment Patient Details Name: Keith Holland MRN: [id number] DOB: 17-Jun-1956 Today's Date: 02/05/2018    History of Present Illness Pt. prestents to hospital status post TKR L with a PMH to include DM type 2, arthritis, prostate cancer, and vertigo.     PT Comments    Pt is progressing well towards goal. Pt was able to transfer supine<>sit with MOD I/verbal cueing, sit to stand with CGA for safety, and ambulate 150' with RW/CGA/chair follow. Pt reported mild dizziness during treatment, and RPE was assessed throughout(moderate). Pt is limited by increased pain and weakness today, but should continue to improve as pain is managed. D/C recommendations continue to remain appropriate at this time.   Follow Up Recommendations  Home health PT     Equipment Recommendations  3in1 (PT)    Recommendations for Other Services       Precautions / Restrictions Precautions Precautions: Knee Precaution Booklet Issued: Yes (comment) Restrictions Weight Bearing Restrictions: Yes LLE Weight Bearing: Weight bearing as tolerated    Mobility  Bed Mobility Overal bed mobility: Needs Assistance Bed Mobility: Sit to Supine     Supine to sit: Modified independent (Device/Increase time) Sit to supine: Modified independent (Device/Increase time)   General bed mobility comments: Pt able to sit>stand with Mod I of hooking affected limb with unaffedted, use of B UE, and increased time.  Transfers Overall transfer level: Needs assistance Equipment used: Rolling walker (2 wheeled) Transfers: Sit to/from Stand Sit to Stand: Min guard         General transfer comment: Pt benefited form CGA for saftey and incresed time.  Ambulation/Gait Ambulation/Gait assistance: Min guard Gait Distance (Feet): 150 Feet Assistive device: Rolling walker (2 wheeled) Gait Pattern/deviations: Decreased step length - right Gait velocity: 10 feet in 20 sec Gait velocity interpretation: <1.31  ft/sec, indicative of household ambulator General Gait Details: pt ambulated 150' CGA, and chair follow. Gait was diminished secondary to increased pain. Pt benefited from verbal cuieng to increase stride length for normalizing gait.   Stairs             Wheelchair Mobility    Modified Rankin (Stroke Patients Only)       Balance Overall balance assessment: Needs assistance Sitting-balance support: No upper extremity supported;Feet supported Sitting balance-Leahy Scale: Normal     Standing balance support: Bilateral upper extremity supported Standing balance-Leahy Scale: Good                              Cognition Arousal/Alertness: Awake/alert Behavior During Therapy: WFL for tasks assessed/performed Overall Cognitive Status: Within Functional Limits for tasks assessed                                        Exercises Total Joint Exercises Goniometric ROM: L Knee AAROM 4-82 Other Exercises Other Exercises: pt instructed in supine there.ex to include ankle pumps x15, quad/glute sets x15, Min A heel slides x12, and Min A hip abd/add x15, MIn A SLR x15. Pt instructed in seated there.ex to include LAQ x15 and repeated flexion stretch x15.    General Comments        Pertinent Vitals/Pain Pain Assessment: 0-10 Pain Score: 4  Pain Location: L knee  Pain Descriptors / Indicators: Discomfort;Grimacing Pain Intervention(s): Limited activity within patient's tolerance;Monitored during session;Patient requesting pain meds-RN notified;Ice applied  Home Living                      Prior Function            PT Goals (current goals can now be found in the care plan section) Acute Rehab PT Goals Patient Stated Goal: return to PLOF with less L knee trouble PT Goal Formulation: With patient Time For Goal Achievement: 02/16/18 Potential to Achieve Goals: Good Progress towards PT goals: Progressing toward goals    Frequency     BID      PT Plan Current plan remains appropriate    Co-evaluation              AM-PAC PT "6 Clicks" Daily Activity  Outcome Measure  Difficulty turning over in bed (including adjusting bedclothes, sheets and blankets)?: A Lot Difficulty moving from lying on back to sitting on the side of the bed? : A Lot Difficulty sitting down on and standing up from a chair with arms (e.g., wheelchair, bedside commode, etc,.)?: Unable Help needed moving to and from a bed to chair (including a wheelchair)?: A Little Help needed walking in hospital room?: A Little Help needed climbing 3-5 steps with a railing? : A Lot 6 Click Score: 13    End of Session Equipment Utilized During Treatment: Gait belt Activity Tolerance: No increased pain;Patient limited by fatigue Patient left: in bed;with call bell/phone within reach;with bed alarm set;with family/visitor present;with SCD's reapplied Nurse Communication: Mobility status;Patient requests pain meds PT Visit Diagnosis: Unsteadiness on feet (R26.81);Other abnormalities of gait and mobility (R26.89);Muscle weakness (generalized) (M62.81);Pain;Difficulty in walking, not elsewhere classified (R26.2) Pain - Right/Left: Left Pain - part of body: Knee     Time: 0109-3235 PT Time Calculation (min) (ACUTE ONLY): 50 min  Charges:                        Rosario Adie, SPT   Rosario Adie 02/05/2018, 11:29 AM

## 2018-02-05 NOTE — Progress Notes (Signed)
Patient is being discharged home with HH/PT. DC & RX instructions given and patient acknowledged understanding. Wife has dressed patient and after lunch wife will take patient home via private vehicle.

## 2018-02-05 NOTE — Progress Notes (Signed)
   Subjective: 3 Days Post-Op Procedure(s) (LRB): TOTAL KNEE ARTHROPLASTY (Left) Patient reports pain as mild.  No HA Patient is well, but has had some minor complaints of Fever last night Denies any CP, SOB, ABD pain, UA symptoms, leg pain We will continue therapy today.  Plan is to go Home after hospital stay.  Objective: Vital signs in last 24 hours: Temp:  [98.9 F (37.2 C)-101.5 F (38.6 C)] 100 F (37.8 C) (07/26 0027) Pulse Rate:  [76-98] 98 (07/26 0027) Resp:  [18-20] 20 (07/26 0027) BP: (105-129)/(59-75) 129/72 (07/26 0027) SpO2:  [90 %-100 %] 95 % (07/26 0027)  Intake/Output from previous day: 07/25 0701 - 07/26 0700 In: 980 [P.O.:480; IV Piggyback:500] Out: 1300 [Urine:1300] Intake/Output this shift: No intake/output data recorded.  Recent Labs    02/03/18 0440 02/04/18 0922  HGB 12.3* 11.6*   Recent Labs    02/03/18 0440 02/04/18 0922  WBC 11.6* 7.8  RBC 3.94* 3.69*  HCT 36.3* 33.9*  PLT 251 227   Recent Labs    02/03/18 0440 02/04/18 0922  NA 140 136  K 4.3 4.2  CL 110 102  CO2 26 28  BUN 12 17  CREATININE 0.66 0.72  GLUCOSE 161* 177*  CALCIUM 8.4* 8.1*   No results for input(s): LABPT, INR in the last 72 hours.  EXAM General - Patient is Alert, Appropriate and Oriented Extremity - Neurovascular intact Sensation intact distally Intact pulses distally Dorsiflexion/Plantar flexion intact No cellulitis present Compartment soft  Patella tracking well Unable to SLR CN II-XII intact Dressing - dressing C/D/I and no drainage, wound vac intact with out drainage Motor Function - intact, moving foot and toes well on exam.   Past Medical History:  Diagnosis Date  . Arthritis   . Cancer [facility]) 2018   Prostate- December 30, 2016 removed  . Complication of anesthesia    severe n/v  . Diabetes mellitus without complication ([place])   . GERD (gastroesophageal reflux disease)   . Hyperlipidemia   . PONV (postoperative nausea and vomiting)   . SAH  (subarachnoid hemorrhage) ([place])    spontaneous 2000 n surgery  . Vertigo    since childhood if on back long will get vertigo     Assessment/Plan:   3 Days Post-Op Procedure(s) (LRB): TOTAL KNEE ARTHROPLASTY (Left) Active Problems:   Status post total knee replacement using cement, left  Estimated body mass index is 31.96 kg/m as calculated from the following:   Height as of this encounter: 5\' 6"  (1.676 m).   Weight as of this encounter: 89.8 kg (198 lb). Advance diet Up with therapy  Needs BM VSS, low grade fever of 99.6. Encouraged incentive spirometer. Check UA/CXR CM to assist with discharge to home with HHPT  Pain controlled  DVT Prophylaxis - Aspirin, TED hose and SCD Weight-Bearing as tolerated to left leg   T. Rachelle Hora, PA-C [place] 02/05/2018, 8:23 AM

## 2018-02-06 LAB — URINE CULTURE: Culture: NO GROWTH

## 2018-04-27 ENCOUNTER — Other Ambulatory Visit: Payer: Self-pay

## 2018-04-27 ENCOUNTER — Other Ambulatory Visit: Payer: BC Managed Care – PPO

## 2018-04-27 DIAGNOSIS — R972 Elevated prostate specific antigen [PSA]: Secondary | ICD-10-CM

## 2018-04-28 LAB — PSA: Prostate Specific Ag, Serum: <0.1 ng/mL (ref 0.0–4.0)

## 2018-04-29 ENCOUNTER — Telehealth: Payer: Self-pay | Admitting: Family Medicine

## 2018-04-29 NOTE — Telephone Encounter (Signed)
-----   Message from Hollice Espy, MD sent at 04/28/2018  8:20 AM EDT ----- PSA remains undetectable!  Hollice Espy, MD

## 2018-04-29 NOTE — Telephone Encounter (Signed)
Patient notified

## 2018-07-01 ENCOUNTER — Other Ambulatory Visit: Payer: Self-pay

## 2018-07-01 ENCOUNTER — Encounter: Payer: Self-pay | Admitting: Radiation Oncology

## 2018-07-01 ENCOUNTER — Ambulatory Visit
Admission: RE | Admit: 2018-07-01 | Discharge: 2018-07-01 | Disposition: A | Discharge status: Home / Self Care | Payer: BC Managed Care – PPO | Source: Ambulatory Visit | Attending: Radiation Oncology | Admitting: Radiation Oncology

## 2018-07-01 DIAGNOSIS — Z923 Personal history of irradiation: Secondary | ICD-10-CM | POA: Diagnosis not present

## 2018-07-01 DIAGNOSIS — C61 Malignant neoplasm of prostate: Secondary | ICD-10-CM | POA: Diagnosis present

## 2018-07-01 NOTE — Progress Notes (Signed)
Radiation Oncology Follow up Note  Name: Keith Holland   Date:   07/01/2018 MRN:  [id number] DOB: 1955-09-28    This 62 y.o. male presents to the clinic today for 11 month follow-up status post salvage radiation therapy for Gleason 7 adenocarcinoma the prostate.  REFERRING PROVIDER: Ezequiel Kayser, MD  HPI: patient is a 62 year old male now out 11 months having completed salvage radiation therapy for Gleason 7 (3+4) adenocarcinoma the prostate with focal microinvasion of the bladder neck. He was seen today in routine follow-up and is doing well. He specifically denies diarrhea dysuria or any other GI/GU complaints. His most recent PSA was 2.33.  COMPLICATIONS OF TREATMENT: none  FOLLOW UP COMPLIANCE: keeps appointments   PHYSICAL EXAM:  BP (P) 136/73 (BP Location: Left Arm, Patient Position: Sitting)   Pulse (P) 65   Resp (P) 18   Wt (P) 202 lb 0.8 oz (91.7 kg)   BMI (P) 32.61 kg/m  Well-developed well-nourished patient in NAD. HEENT reveals PERLA, EOMI, discs not visualized.  Oral cavity is clear. No oral mucosal lesions are identified. Neck is clear without evidence of cervical or supraclavicular adenopathy. Lungs are clear to A&P. Cardiac examination is essentially unremarkable with regular rate and rhythm without murmur rub or thrill. Abdomen is benign with no organomegaly or masses noted. Motor sensory and DTR levels are equal and symmetric in the upper and lower extremities. Cranial nerves II through XII are grossly intact. Proprioception is intact. No peripheral adenopathy or edema is identified. No motor or sensory levels are noted. Crude visual fields are within normal range.  RADIOLOGY RESULTS: films for review  PLAN: at the present time he continues under excellent biochemical control of his prostate cancer. I will go to once your follow-up visits with repeat PSA at that time.I'm please was overall progress. Patient is to call with any concerns at any time.  I would like to  take this opportunity to thank you for allowing me to participate in the care of your patient.Noreene Filbert, MD

## 2018-07-06 IMAGING — MR MR KNEE*L* W/O CM
6 series · 39 of 40 positions shown · non-contrast
Comparison: None.

CLINICAL DATA: Osteoarthritis, pain

EXAM:
MRI OF THE LEFT KNEE WITHOUT CONTRAST
TECHNIQUE: Multiplanar, multisequence MR imaging of the knee was performed. No
intravenous contrast was administered.

[Series 4: PD fat-sat · axial · 3.0mm · 0.33mm/px · z∈[-51,+71]mm · 8 of 38 slices shown (1 of 4)]
[im 1/38]
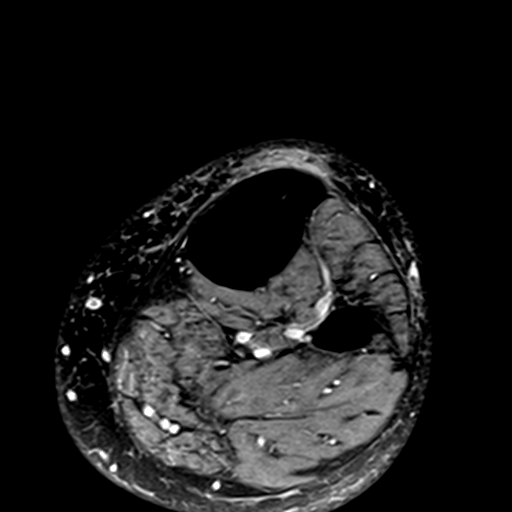
[im 6/38]
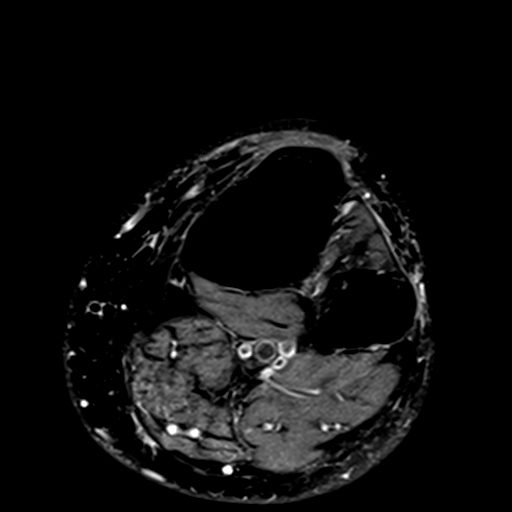
[im 11/38]
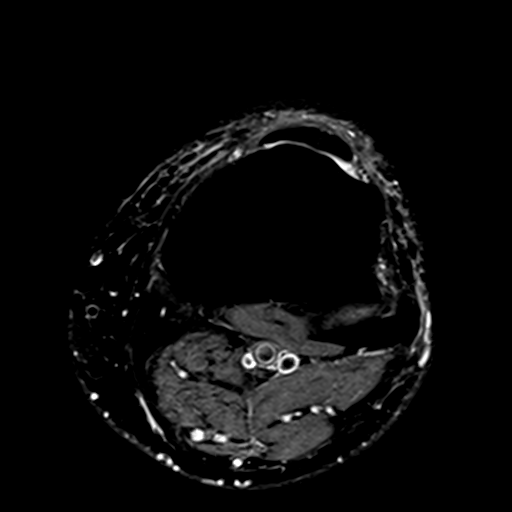
[im 16/38]
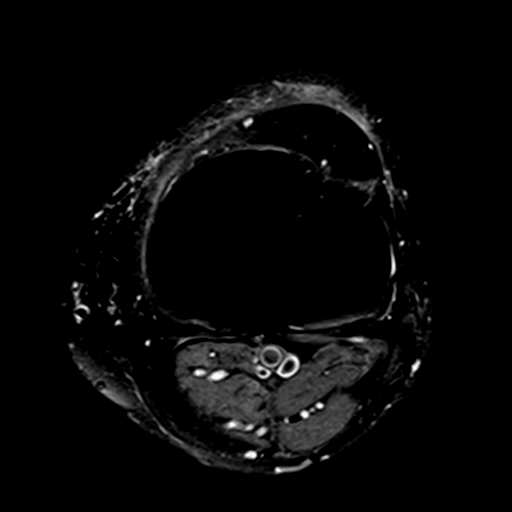
[im 22/38]
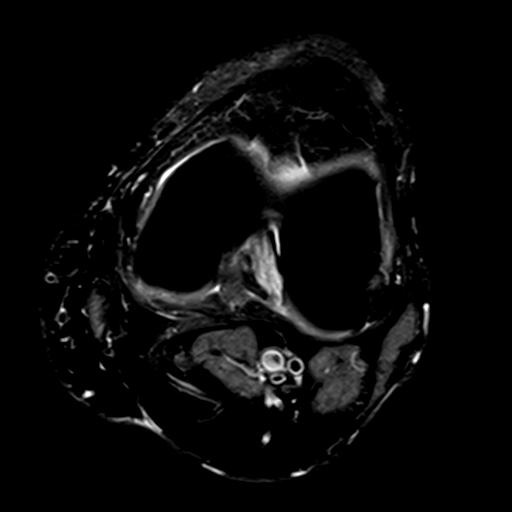
[im 27/38]
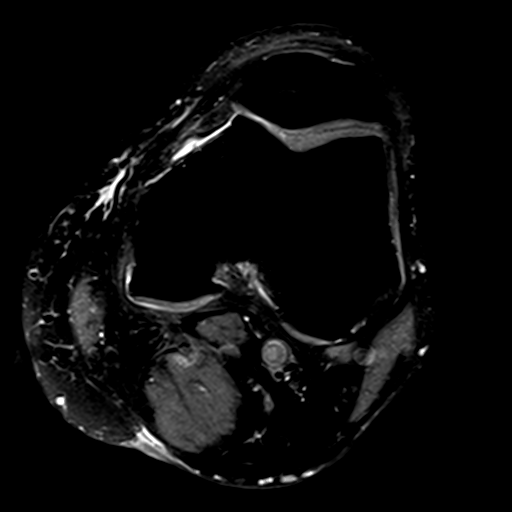
[im 32/38]
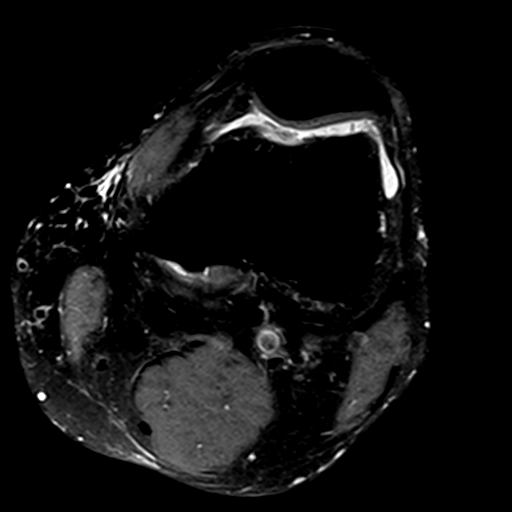
[im 38/38]
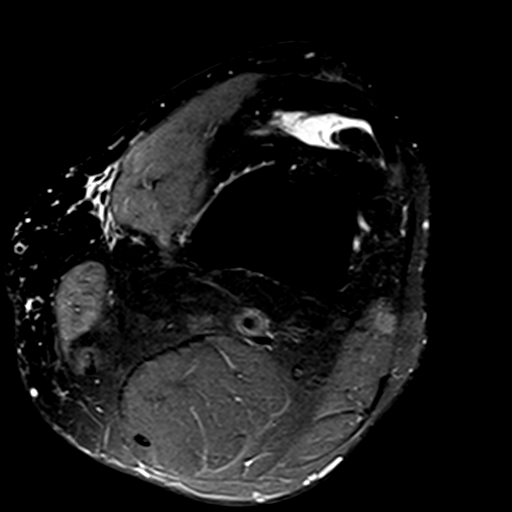

[Series 5: T1 · coronal · 3.0mm · 0.50mm/px · 6 of 35 slices shown]
[im 1/35]
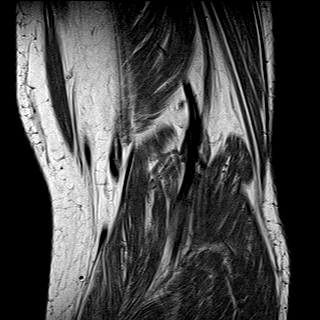
[im 6/35]
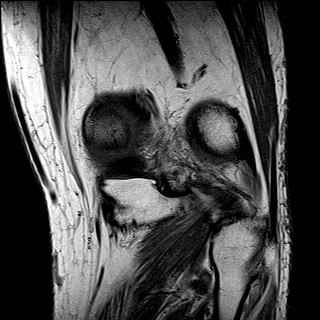
[im 12/35]
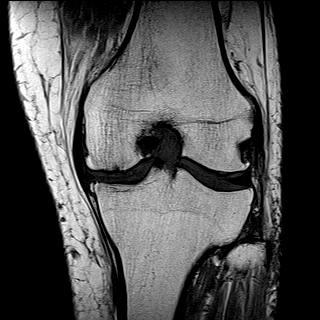
[im 18/35]
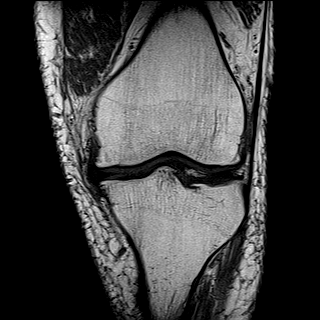
[im 23/35]
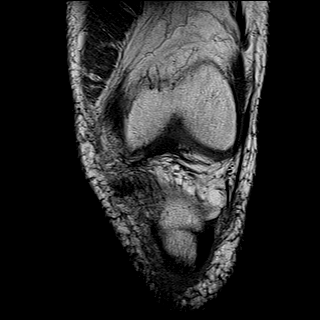
[im 29/35]
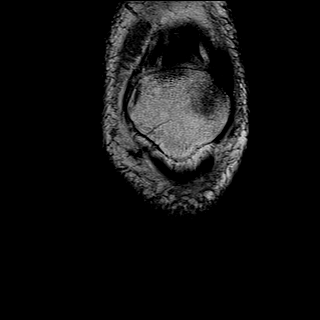

[Series 6: T2 fat-sat · coronal · 3.0mm · 0.50mm/px · 7 of 35 slices shown]
[im 1/35]
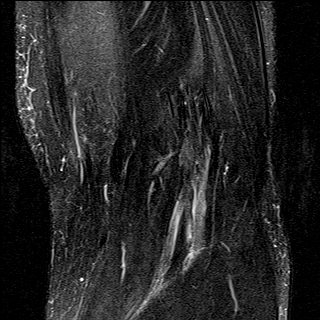
[im 6/35]
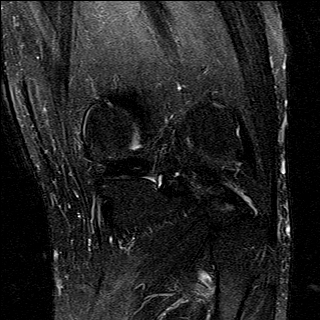
[im 12/35]
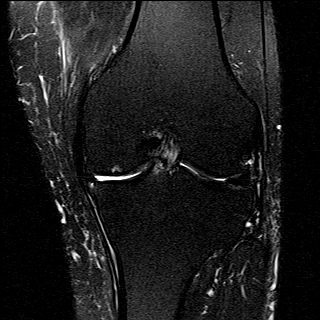
[im 18/35]
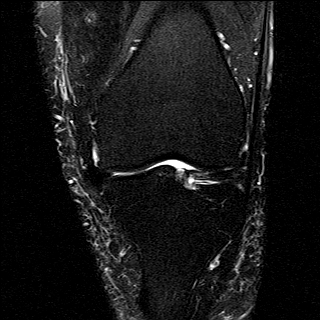
[im 23/35]
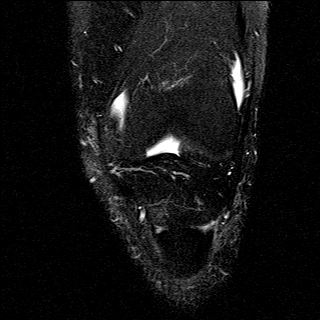
[im 29/35]
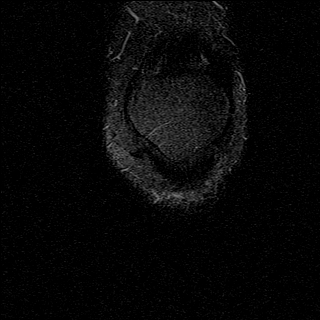
[im 35/35]
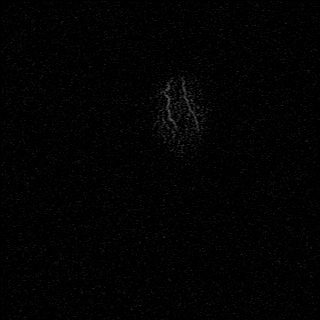

[Series 7: PD fat-sat · coronal · 3.0mm · 0.62mm/px · 7 of 35 slices shown (2 of 4)]
[im 1/35]
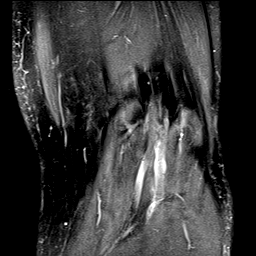
[im 6/35]
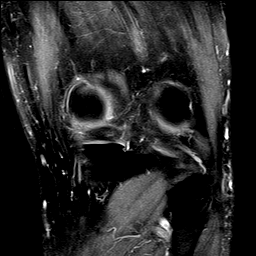
[im 12/35]
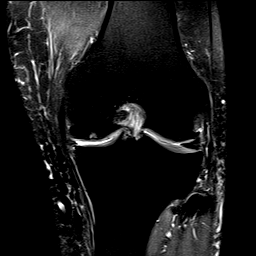
[im 18/35]
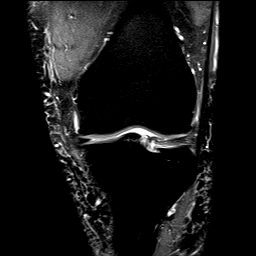
[im 23/35]
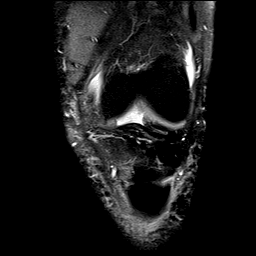
[im 29/35]
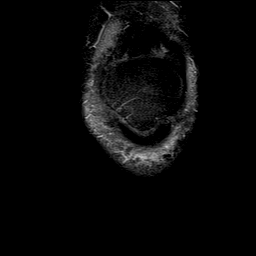
[im 35/35]
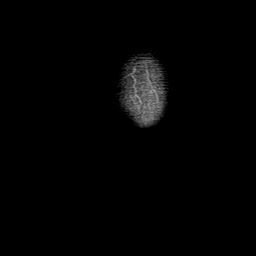

[Series 8: PD fat-sat · sagittal · 3.0mm · 0.62mm/px · 7 of 36 slices shown (3 of 4)]
[im 1/36]
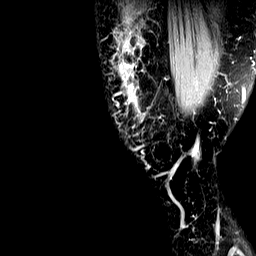
[im 6/36]
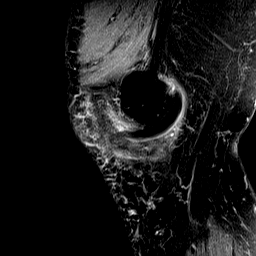
[im 12/36]
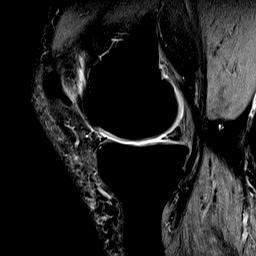
[im 18/36]
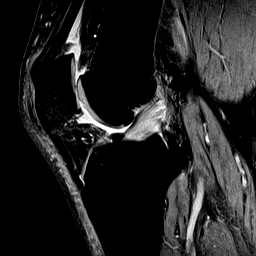
[im 24/36]
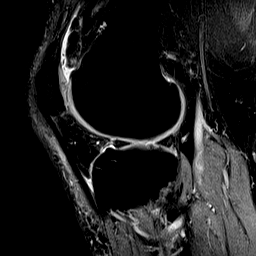
[im 30/36]
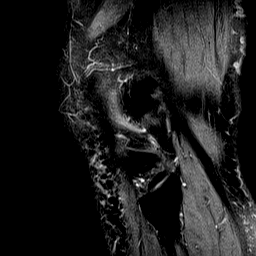
[im 36/36]
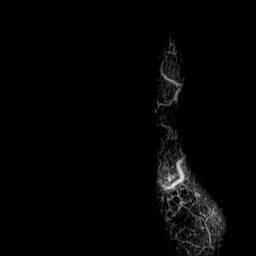

[Series 9: PD fat-sat · coronal · 2.0mm · 0.62mm/px · 4 of 19 slices shown (4 of 4)]
[im 1/19]
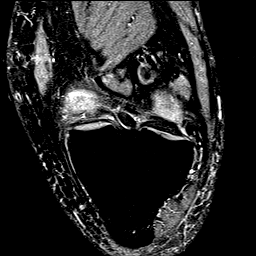
[im 7/19]
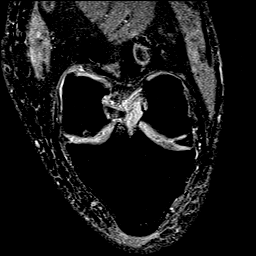
[im 13/19]
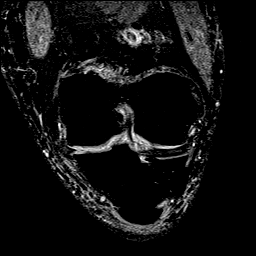
[im 19/19]
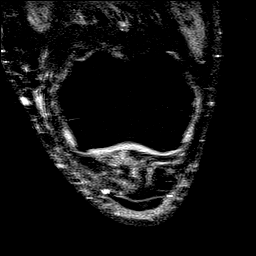

[39 of 40 positions shown; findings below may reference images not displayed]

FINDINGS: MENISCI

Medial meniscus: Complex tear of the posterior horn of the medial
meniscus. Radial tear of the free edge of the body of medial
meniscus.

Lateral meniscus:  Intact.

LIGAMENTS

Cruciates: Intact ACL and PCL. ACL is increased in signal and
expanded consistent with mucinous degeneration.

Collaterals: Medial collateral ligament is intact. Lateral
collateral ligament complex is intact.

CARTILAGE

Patellofemoral: High-grade partial-thickness cartilage loss of the
medial patellofemoral compartment.

Medial: High-grade partial-thickness cartilage loss with areas of
full-thickness cartilage loss of the medial femorotibial
compartment.

Lateral: Partial-thickness cartilage loss of the lateral femoral
condyle. Cartilage irregularity of the lateral tibial plateau. Small
marginal osteophytes.

Joint: Small joint effusion. Mild edema in Hoffa's fat. No plical
thickening.

Popliteal Fossa:  No Baker cyst. Intact popliteus tendon.

Extensor Mechanism: Intact quadriceps tendon. Intact patellar
tendon. Intact medial patellar retinaculum. Intact lateral patellar
retinaculum. Intact MPFL.

Bones:  No acute osseous abnormality.  No aggressive osseous lesion.

Other: No fluid collection or hematoma.  Muscles are normal.
IMPRESSION: 1. Complex tear of the posterior horn of the medial meniscus. Radial
tear of the free edge of the body of medial meniscus.
2. Tricompartmental cartilage abnormalities as described above most
severe in the medial femorotibial compartment. Severe osteoarthritis
of the medial femorotibial compartment.
3. Intact ACL with mucinous degeneration.

## 2018-10-15 ENCOUNTER — Other Ambulatory Visit: Payer: Self-pay

## 2018-10-15 DIAGNOSIS — R972 Elevated prostate specific antigen [PSA]: Secondary | ICD-10-CM

## 2018-10-21 ENCOUNTER — Other Ambulatory Visit: Payer: BC Managed Care – PPO

## 2018-10-26 ENCOUNTER — Ambulatory Visit: Payer: BC Managed Care – PPO | Admitting: Urology

## 2018-12-11 DIAGNOSIS — K529 Noninfective gastroenteritis and colitis, unspecified: Secondary | ICD-10-CM | POA: Diagnosis present

## 2018-12-11 DIAGNOSIS — K625 Hemorrhage of anus and rectum: Secondary | ICD-10-CM | POA: Diagnosis not present

## 2018-12-12 ENCOUNTER — Other Ambulatory Visit
Admission: RE | Admit: 2018-12-12 | Discharge: 2018-12-12 | Disposition: A | Discharge status: Home / Self Care | Payer: BC Managed Care – PPO | Attending: Orthopedic Surgery | Admitting: Orthopedic Surgery

## 2018-12-12 DIAGNOSIS — K529 Noninfective gastroenteritis and colitis, unspecified: Secondary | ICD-10-CM | POA: Insufficient documentation

## 2018-12-12 DIAGNOSIS — K625 Hemorrhage of anus and rectum: Secondary | ICD-10-CM | POA: Insufficient documentation

## 2018-12-12 LAB — C DIFFICILE QUICK SCREEN W PCR REFLEX??
C Diff antigen: NEGATIVE
C Diff interpretation: NOT DETECTED
C Diff toxin: NEGATIVE

## 2018-12-13 LAB — GASTROINTESTINAL PANEL BY PCR, STOOL (REPLACES STOOL CULTURE)

## 2018-12-17 LAB — CALPROTECTIN, FECAL: Calprotectin, Fecal: 75 ug/g (ref 0–120)

## 2018-12-17 LAB — PANCREATIC ELASTASE, FECAL: Pancreatic Elastase-1, Stool: 434 ug Elast./g (ref >200)

## 2018-12-21 ENCOUNTER — Other Ambulatory Visit: Payer: BC Managed Care – PPO

## 2018-12-21 ENCOUNTER — Other Ambulatory Visit: Payer: Self-pay

## 2018-12-21 DIAGNOSIS — R972 Elevated prostate specific antigen [PSA]: Secondary | ICD-10-CM

## 2018-12-22 LAB — PSA: Prostate Specific Ag, Serum: <0.1 ng/mL (ref 0.0–4.0)

## 2018-12-29 ENCOUNTER — Ambulatory Visit: Payer: BC Managed Care – PPO | Admitting: Urology

## 2018-12-30 ENCOUNTER — Telehealth (INDEPENDENT_AMBULATORY_CARE_PROVIDER_SITE_OTHER): Payer: BC Managed Care – PPO | Admitting: Urology

## 2018-12-30 ENCOUNTER — Other Ambulatory Visit: Payer: Self-pay

## 2018-12-30 DIAGNOSIS — N393 Stress incontinence (female) (male): Secondary | ICD-10-CM

## 2018-12-30 DIAGNOSIS — N5231 Erectile dysfunction following radical prostatectomy: Secondary | ICD-10-CM

## 2018-12-30 DIAGNOSIS — C61 Malignant neoplasm of prostate: Secondary | ICD-10-CM

## 2018-12-30 MED ORDER — NONFORMULARY OR COMPOUNDED ITEM: 6 refills | Status: DC

## 2018-12-30 NOTE — Addendum Note (Signed)
Addended by: Verlene Mayer A on: 12/30/2018 11:27 AM   Modules accepted: Orders

## 2018-12-30 NOTE — Progress Notes (Signed)
Virtual Visit via Telephone Note  I connected with Keith Holland on 12/30/18 at  9:30 AM EDT by telephone and verified that I am speaking with the correct person using two identifiers.  We had originally scheduled a virtual visit but the patient was unable to connect via video.  Location: Patient: work Provider: home   I discussed the limitations, risks, security and privacy concerns of performing an evaluation and management service by telephone and the availability of in person appointments. I also discussed with the patient that there may be a patient responsible charge related to this service. The patient expressed understanding and agreed to proceed.   History of Present Illness: 63 year old male who s/p radical robotic prostatectomy 12/2016 who returns today for routine follow up via virtual visit.    He was initially diagnosed with Gleason 3+3 prostate cancer, PSA 9.5 disease. He elected to proceed with radical robotic prostatectomy which was performed on 12/29/2016.  Lymphadenectomy was not performed based on risk stratification.   Surgical pathology was consistent with Gleason 3+4, pT3aNxMx with focal microscopic invasion of the bladder neck with a unifocal 1 mm positive margin at this level. + extraprostatic extension at the bladder neck,+ bladder neck margin (right), - LVI, +PNI.  He underwent adjuvant radiation in 28/4132 without complication.  This was well-tolerated.  He reports that after the salvage radiation, he backed up with his continence.  He does have stress incontinence 2 ppd when active.  When not active, minimal leakage.  He reports that this is worse than after radiation.  He has not been diligent about doing his Kegel exercises.  He did have a knee replacement and notes worsening of his symptoms after the knee replacement.  He also reports that he started having partial penile fullness augmented by sildenafil.    More recently, he underwent intracavernosal  injection teaching and has been using Trimix.  He reports that he has had response this medication but only partial erection.  He is using 5 units (half a cc) which is the most and is prefilled syringe.  He would be interested in trying a higher dose.  PSA as of 6/20  continues to remain undetectable, <0.1   Observations/Objective:   Assessment and Plan:  1. Prostate cancer [facility]) NED Continue Q 58-month PSAs, lab only in 6 months and see MD in 1 year  2. SUI (stress urinary incontinence), male Slightly worse than previous baseline Current resumption of pelvic floor exercises Offered referral back to physical therapy, declined Discussed alternative option for activities including penile clamp, information provided He would like to give this a try will reach out to [place] with any concerns  3. Erectile dysfunction after radical prostatectomy Partial response P5 inhibitors Currently using intracavernosal injections also with partial response I have encouraged him to increase his dose, will increase to 0.7 mL titrating up to 1 mL as needed of traditional Trimix solution He is agreeable this plan-we will call into compounding pharmacy  Follow Up Instructions: 6 months PSA only, 12 months with MD/ PSA   I discussed the assessment and treatment plan with the patient. The patient was provided an opportunity to ask questions and all were answered. The patient agreed with the plan and demonstrated an understanding of the instructions.   The patient was advised to call back or seek an in-person evaluation if the symptoms worsen or if the condition fails to improve as anticipated.  I provided 15 minutes of non-face-to-face time during this encounter.  Hollice Espy, MD

## 2019-05-25 ENCOUNTER — Encounter: Payer: Self-pay | Admitting: *Deleted

## 2019-06-29 ENCOUNTER — Ambulatory Visit: Payer: BC Managed Care – PPO | Admitting: Radiation Oncology

## 2019-06-30 ENCOUNTER — Other Ambulatory Visit: Payer: BC Managed Care – PPO

## 2019-06-30 ENCOUNTER — Other Ambulatory Visit: Payer: Self-pay

## 2019-06-30 DIAGNOSIS — C61 Malignant neoplasm of prostate: Secondary | ICD-10-CM

## 2019-07-01 LAB — PSA: Prostate Specific Ag, Serum: <0.1 ng/mL (ref 0.0–4.0)

## 2019-07-13 ENCOUNTER — Ambulatory Visit: Payer: BC Managed Care – PPO | Admitting: Radiation Oncology

## 2019-07-26 ENCOUNTER — Other Ambulatory Visit: Payer: Self-pay

## 2019-07-27 ENCOUNTER — Encounter: Payer: Self-pay | Admitting: Radiation Oncology

## 2019-07-27 ENCOUNTER — Other Ambulatory Visit: Payer: Self-pay

## 2019-07-27 ENCOUNTER — Ambulatory Visit
Admission: RE | Admit: 2019-07-27 | Discharge: 2019-07-27 | Disposition: A | Discharge status: Home / Self Care | Payer: BC Managed Care – PPO | Source: Ambulatory Visit | Attending: Radiation Oncology | Admitting: Radiation Oncology

## 2019-07-27 VITALS — BP 145/78 | HR 64 | Temp 97.4°F | Resp 16 | Wt 207.5 lb

## 2019-07-27 DIAGNOSIS — C61 Malignant neoplasm of prostate: Secondary | ICD-10-CM | POA: Insufficient documentation

## 2019-07-27 DIAGNOSIS — Z923 Personal history of irradiation: Secondary | ICD-10-CM | POA: Insufficient documentation

## 2019-07-27 NOTE — Progress Notes (Signed)
Radiation Oncology Follow up Note  Name: Keith Holland   Date:   07/27/2019 MRN:  [id number] DOB: 10/04/1955    This 64 y.o. male presents to the clinic today for 2-year follow-up status post.  Salvage radiation therapy to his prostatic fossa for Gleason 7 adenocarcinoma  REFERRING PROVIDER: Ezequiel Kayser, MD  HPI: Patient is a 64 year old male now at 2 years having completed salvage radiation therapy to his prostatic fossa status post prostatectomy with focal microinvasion of the bladder neck for Gleason 7 (3+4) adenocarcinoma.  Seen today in routine follow-up he is doing well.  He specifically denies any increased lower urinary tract symptoms diarrhea or fatigue.  His most recent PSA is less than 0.1.  COMPLICATIONS OF TREATMENT: none  FOLLOW UP COMPLIANCE: keeps appointments   PHYSICAL EXAM:  BP (!) 145/78 (BP Location: Left Arm, Patient Position: Sitting)   Pulse 64   Temp (!) 97.4 F (36.3 C) (Tympanic)   Resp 16   Wt 207 lb 8 oz (94.1 kg)   BMI 33.49 kg/m  Well-developed well-nourished patient in NAD. HEENT reveals PERLA, EOMI, discs not visualized.  Oral cavity is clear. No oral mucosal lesions are identified. Neck is clear without evidence of cervical or supraclavicular adenopathy. Lungs are clear to A&P. Cardiac examination is essentially unremarkable with regular rate and rhythm without murmur rub or thrill. Abdomen is benign with no organomegaly or masses noted. Motor sensory and DTR levels are equal and symmetric in the upper and lower extremities. Cranial nerves II through XII are grossly intact. Proprioception is intact. No peripheral adenopathy or edema is identified. No motor or sensory levels are noted. Crude visual fields are within normal range.  RADIOLOGY RESULTS: No current films to review  PLAN: Present time patient is doing well under excellent biochemical control of his prostate cancer status post salvage radiation therapy.  I am pleased with his overall  progress.  Of asked to see him back in 1 year with a follow-up PSA at that time.  He continues close follow-up care with Dr. Erlene Quan.  Patient knows to call with any concerns.  I would like to take this opportunity to thank you for allowing me to participate in the care of your patient.Noreene Filbert, MD

## 2019-12-30 ENCOUNTER — Other Ambulatory Visit: Payer: BC Managed Care – PPO

## 2019-12-30 ENCOUNTER — Other Ambulatory Visit: Payer: Self-pay

## 2019-12-30 DIAGNOSIS — C61 Malignant neoplasm of prostate: Secondary | ICD-10-CM

## 2019-12-31 LAB — PSA: Prostate Specific Ag, Serum: <0.1 ng/mL (ref 0.0–4.0)

## 2020-01-03 NOTE — Progress Notes (Signed)
01/04/20 1:29 PM    Keith Holland [birth year] [id number]  Referring provider: Ezequiel Kayser, MD [place] [facility] [place],  [place] [address] Chief Complaint  Patient presents with  . Prostate Cancer    HPI: Keith Holland is a 64 y.o. M s/p radical robotic prostatectomy 12/2016 who returns today for routine follow up.  He was initially diagnosed with Gleason 3+3 prostate cancer, PSA 9.5 disease. He elected to proceed with radical robotic prostatectomy which was performed on 12/29/2016. Lymphadenectomy was not performed based on risk stratification.   Surgical pathology was consistent with Gleason 3+4, pT3aNxMx with focal microscopic invasion of the bladder neck with a unifocal 1 mm positive margin at this level. + extraprostatic extension at the bladder neck,+ bladder neck margin (right), - LVI, +PNI.  He underwent adjuvant radiation in [id number] complication. This was well-tolerated.  Most recent PSA undetectable as of 12/30/19.   He has been using penile clamps and is satisfied w/ his urinary symptoms. He reports of leakage however improved from before.   He states of difficulties w/ erection and is not interested in intervention or further treatment at this time.   PMH: Past Medical History:  Diagnosis Date  . Arthritis   . Cancer [facility]) 2018   Prostate- December 30, 2016 removed  . Complication of anesthesia    severe n/v  . Diabetes mellitus without complication ([place])   . GERD (gastroesophageal reflux disease)   . Hyperlipidemia   . PONV (postoperative nausea and vomiting)   . SAH (subarachnoid hemorrhage) ([place])    spontaneous 2000 n surgery  . Vertigo    since childhood if on back long will get vertigo     Surgical History: Past Surgical History:  Procedure Laterality Date  . MENISCUS REPAIR     x 3  . ROBOT ASSISTED LAPAROSCOPIC RADICAL PROSTATECTOMY N/A 12/29/2016   Procedure: ROBOTIC ASSISTED LAPAROSCOPIC RADICAL PROSTATECTOMY;   Surgeon: Hollice Espy, MD;  Location: [facility] ORS;  Service: Urology;  Laterality: N/A;  . TOTAL KNEE ARTHROPLASTY Left 02/02/2018   Procedure: TOTAL KNEE ARTHROPLASTY;  Surgeon: Hessie Knows, MD;  Location: [facility] ORS;  Service: Orthopedics;  Laterality: Left;    Home Medications:  Allergies as of 01/04/2020      Reactions   Percocet [oxycodone-acetaminophen] Nausea And Vomiting   Vicodin [hydrocodone-acetaminophen] Nausea And Vomiting, Other (See Comments)   Can tolerate with Phenergan      Medication List       Accurate as of January 04, 2020  1:29 PM. If you have any questions, ask your nurse or doctor.        acetaminophen 500 MG tablet Commonly known as: TYLENOL Take 1-2 tablets (500-1,000 mg total) by mouth every 6 (six) hours as needed for mild pain or fever (pain score 1-3 or temp > 100.5).   glipiZIDE 5 MG tablet Commonly known as: GLUCOTROL Take by mouth daily before breakfast.   metFORMIN 500 MG 24 hr tablet Commonly known as: GLUCOPHAGE-XR Take 1,000 mg by mouth at bedtime.   NONFORMULARY OR COMPOUNDED ITEM Trimix (30/1/10)-(Pap/Phent/PGE)  Dosage: Inject 1 cc per injection  Vial 5ml  Qty 1 vial Refills 6  [place] [phone] Fax [phone]   omeprazole 20 MG capsule Commonly known as: PRILOSEC Take 20 mg by mouth daily.   pioglitazone 30 MG tablet Commonly known as: ACTOS Take 30 mg by mouth every evening.   simvastatin 40 MG tablet Commonly known as: ZOCOR Take 40 mg by  mouth every evening.   Tyler Aas FlexTouch 200 UNIT/ML FlexTouch Pen Generic drug: insulin degludec Inject 20 Units into the skin at bedtime.       Allergies:  Allergies  Allergen Reactions  . Percocet [Oxycodone-Acetaminophen] Nausea And Vomiting  . Vicodin [Hydrocodone-Acetaminophen] Nausea And Vomiting and Other (See Comments)    Can tolerate with Phenergan    Family History: Family History  Problem Relation Age of Onset  . Prostate cancer Father     . Stroke Father   . Stroke Mother   . Bladder Cancer Neg Hx   . Kidney cancer Neg Hx     Social History:  reports that he has never smoked. He quit smokeless tobacco use about 30 years ago.  His smokeless tobacco use included chew. He reports current alcohol use. He reports that he does not use drugs.   Physical Exam: BP (!) 170/86   Pulse (!) 54   Ht 5\' 6"  (1.676 m)   Wt 204 lb (92.5 kg)   BMI 32.93 kg/m   Constitutional:  Alert and oriented, No acute distress. HEENT: [place] AT, moist mucus membranes.  Trachea midline, no masses. Cardiovascular: No clubbing, cyanosis, or edema. Respiratory: Normal respiratory effort, no increased work of breathing. Skin: No rashes, bruises or suspicious lesions. Neurologic: Grossly intact, no focal deficits, moving all 4 extremities. Psychiatric: Normal mood and affect.  Assessment & Plan:    1. Prostate cancer [facility]) NED Continue Q 30-month PSAs, lab only in 6 months and see MD in 1 year  2. SUI (stress urinary incontinence), male Improved, currently uses penile clamp  Current resumption of pelvic floor exercises Not interested in any further intervention  3. Erectile dysfunction after radical prostatectomy Partial response P5 inhibitors Currently using intracavernosal injections also with partial response- Not effective Declined procedural intervention   F/u 1 year with PSA (lab only in 6 months)  [place] [address], [place], [place] [address] [phone]  I, [name], am acting as a scribe for Dr. Hollice Espy,  I have reviewed the above documentation for accuracy and completeness, and I agree with the above.   Hollice Espy, MD

## 2020-01-04 ENCOUNTER — Ambulatory Visit: Payer: BC Managed Care – PPO | Admitting: Urology

## 2020-01-04 ENCOUNTER — Other Ambulatory Visit: Payer: Self-pay

## 2020-01-04 VITALS — BP 170/86 | HR 54 | Ht 66.0 in | Wt 204.0 lb

## 2020-01-04 DIAGNOSIS — C61 Malignant neoplasm of prostate: Secondary | ICD-10-CM | POA: Diagnosis not present

## 2020-03-29 ENCOUNTER — Encounter: Payer: Self-pay | Admitting: Oncology

## 2020-03-29 ENCOUNTER — Other Ambulatory Visit: Payer: Self-pay | Admitting: Oncology

## 2020-03-29 DIAGNOSIS — U071 COVID-19: Secondary | ICD-10-CM

## 2020-03-29 NOTE — Progress Notes (Signed)
I connected by phone with  Keith Holland  to discuss the potential use of an new treatment for mild to moderate COVID-19 viral infection in non-hospitalized patients.   This patient is a age/sex that meets the FDA criteria for Emergency Use Authorization of casirivimab\imdevimab.  Has a (+) direct SARS-CoV-2 viral test result 1. Has mild or moderate COVID-19  2. Is ? 63 years of age and weighs ? 40 kg 3. Is NOT hospitalized due to COVID-19 4. Is NOT requiring oxygen therapy or requiring an increase in baseline oxygen flow rate due to COVID-19 5. Is within 10 days of symptom onset 6. Has at least one of the high risk factor(s) for progression to severe COVID-19 and/or hospitalization as defined in EUA. Specific high risk criteria : Past Medical History:  Diagnosis Date  . Arthritis   . Cancer [facility]) 2018   Prostate- December 30, 2016 removed  . Complication of anesthesia    severe n/v  . Diabetes mellitus without complication ([place])   . GERD (gastroesophageal reflux disease)   . Hyperlipidemia   . PONV (postoperative nausea and vomiting)   . SAH (subarachnoid hemorrhage) ([place])    spontaneous 2000 n surgery  . Vertigo    since childhood if on back long will get vertigo   ?   Symptom onset  03/26/2020   I have spoken and communicated the following to the patient or parent/caregiver:   1. FDA has authorized the emergency use of casirivimab\imdevimab for the treatment of mild to moderate COVID-19 in adults and pediatric patients with positive results of direct SARS-CoV-2 viral testing who are 64 years of age and older weighing at least 40 kg, and who are at high risk for progressing to severe COVID-19 and/or hospitalization.   2. The significant known and potential risks and benefits of casirivimab\imdevimab, and the extent to which such potential risks and benefits are unknown.   3. Information on available alternative treatments and the risks and benefits of those alternatives, including  clinical trials.   4. Patients treated with casirivimab\imdevimab should continue to self-isolate and use infection control measures (e.g., wear mask, isolate, social distance, avoid sharing personal items, clean and disinfect "high touch" surfaces, and frequent handwashing) according to CDC guidelines.    5. The patient or parent/caregiver has the option to accept or refuse casirivimab\imdevimab .   After reviewing this information with the patient, The patient agreed to proceed with receiving casirivimab\imdevimab infusion and will be provided a copy of the Fact sheet prior to receiving the infusion.Rulon Abide, AGNP-C [phone] ([place])

## 2020-03-30 ENCOUNTER — Other Ambulatory Visit (HOSPITAL_COMMUNITY): Payer: Self-pay

## 2020-03-30 ENCOUNTER — Ambulatory Visit (HOSPITAL_COMMUNITY)
Admission: RE | Admit: 2020-03-30 | Discharge: 2020-03-30 | Disposition: A | Discharge status: Home / Self Care | Payer: BC Managed Care – PPO | Source: Ambulatory Visit | Attending: Pulmonary Disease | Admitting: Pulmonary Disease

## 2020-03-30 DIAGNOSIS — U071 COVID-19: Secondary | ICD-10-CM | POA: Diagnosis present

## 2020-03-30 MED ORDER — SODIUM CHLORIDE 0.9 % IV SOLN: INTRAVENOUS | Status: DC | PRN

## 2020-03-30 MED ORDER — ALBUTEROL SULFATE HFA 108 (90 BASE) MCG/ACT IN AERS: 2.0000 | INHALATION_SPRAY | Freq: Once | RESPIRATORY_TRACT | Status: DC | PRN

## 2020-03-30 MED ORDER — DIPHENHYDRAMINE HCL 50 MG/ML IJ SOLN: 50.0000 mg | Freq: Once | INTRAMUSCULAR | Status: DC | PRN

## 2020-03-30 MED ORDER — FAMOTIDINE IN NACL 20-0.9 MG/50ML-% IV SOLN: 20.0000 mg | Freq: Once | INTRAVENOUS | Status: DC | PRN

## 2020-03-30 MED ORDER — EPINEPHRINE 0.3 MG/0.3ML IJ SOAJ: 0.3000 mg | Freq: Once | INTRAMUSCULAR | Status: DC | PRN

## 2020-03-30 MED ORDER — METHYLPREDNISOLONE SODIUM SUCC 125 MG IJ SOLR: 125.0000 mg | Freq: Once | INTRAMUSCULAR | Status: DC | PRN

## 2020-03-30 MED ORDER — SODIUM CHLORIDE 0.9 % IV SOLN
1200.0000 mg | Freq: Once | INTRAVENOUS | Status: AC
  Administered 2020-03-30: 1200 mg via INTRAVENOUS

## 2020-03-30 NOTE — Progress Notes (Addendum)
  Diagnosis: COVID-19  Physician: Dr Joya Gaskins  Procedure: [facility] Med: casirivimab\imdevimab infusion - Provided patient with casirivimab\imdevimab fact sheet for patients, parents and caregivers prior to infusion.  Complications: No immediate complications noted.  Discharge: Discharged home   Keith Holland 03/30/2020

## 2020-03-30 NOTE — Discharge Instructions (Signed)

## 2020-07-11 ENCOUNTER — Other Ambulatory Visit: Payer: BC Managed Care – PPO

## 2020-07-11 ENCOUNTER — Other Ambulatory Visit: Payer: Self-pay

## 2020-07-11 DIAGNOSIS — C61 Malignant neoplasm of prostate: Secondary | ICD-10-CM

## 2020-07-12 LAB — PSA: Prostate Specific Ag, Serum: <0.1 ng/mL (ref 0.0–4.0)

## 2020-08-01 ENCOUNTER — Ambulatory Visit: Payer: BC Managed Care – PPO | Admitting: Radiation Oncology

## 2020-10-13 ENCOUNTER — Other Ambulatory Visit: Payer: Self-pay

## 2020-10-13 ENCOUNTER — Encounter: Payer: Self-pay | Admitting: Emergency Medicine

## 2020-10-13 ENCOUNTER — Ambulatory Visit
Admission: EM | Admit: 2020-10-13 | Discharge: 2020-10-13 | Disposition: A | Discharge status: Home / Self Care | Payer: BC Managed Care – PPO | Attending: Sports Medicine | Admitting: Sports Medicine

## 2020-10-13 DIAGNOSIS — S0101XA Laceration without foreign body of scalp, initial encounter: Secondary | ICD-10-CM | POA: Diagnosis not present

## 2020-10-13 DIAGNOSIS — S0990XA Unspecified injury of head, initial encounter: Secondary | ICD-10-CM

## 2020-10-13 NOTE — ED Provider Notes (Signed)
[facility]    CSN: [id number] Arrival date & time: 10/13/20  1310      History   Chief Complaint Chief Complaint  Patient presents with  . Head Laceration    HPI Keith Holland is a 65 y.o. male.   Patient is a pleasant 65 year old male who presents for evaluation of an injury to his head.  Just prior to arrival he was at his daughter's house and doing some work in the attic.  He hit his head on one of the rafters.  He did not lose consciousness.  No vision changes.  He has a mild headache.  No nausea no vomiting.  He has never had any significant head injury in the past.  Normally sees Dr. Genene Churn for ongoing medical care.  Due to the bleeding he comes into urgent care today for evaluation and management.  No red flag signs or symptoms elicited on history.     Past Medical History:  Diagnosis Date  . Arthritis   . Cancer [facility]) 2018   Prostate- December 30, 2016 removed  . Complication of anesthesia    severe n/v  . Diabetes mellitus without complication ([place])   . GERD (gastroesophageal reflux disease)   . Hyperlipidemia   . PONV (postoperative nausea and vomiting)   . SAH (subarachnoid hemorrhage) ([place])    spontaneous 2000 n surgery  . Vertigo    since childhood if on back long will get vertigo     Patient Active Problem List   Diagnosis Date Noted  . 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 Prostate cancer ([place]) 11/30/2016  . Elevated PSA 10/13/2016    Past Surgical History:  Procedure Laterality Date  . MENISCUS REPAIR     x 3  . ROBOT ASSISTED LAPAROSCOPIC RADICAL PROSTATECTOMY N/A 12/29/2016   Procedure: ROBOTIC ASSISTED LAPAROSCOPIC RADICAL PROSTATECTOMY;  Surgeon: Hollice Espy, MD;  Location: [facility] ORS;  Service: Urology;  Laterality: N/A;  . TOTAL KNEE ARTHROPLASTY Left 02/02/2018   Procedure: TOTAL KNEE ARTHROPLASTY;  Surgeon: Hessie Knows, MD;  Location: [facility] ORS;  Service: Orthopedics;  Laterality: Left;        Home Medications    Prior to Admission medications   Medication Sig Start Date End Date Taking? Authorizing Provider  glipiZIDE (GLUCOTROL) 5 MG tablet Take by mouth daily before breakfast.   Yes [provider]  insulin degludec (TRESIBA) 200 UNIT/ML FlexTouch Pen Inject 20 Units into the skin at bedtime.  05/11/17  Yes [provider]  metFORMIN (GLUCOPHAGE-XR) 500 MG 24 hr tablet Take 1,000 mg by mouth at bedtime.  01/16/16  Yes [provider]  omeprazole (PRILOSEC) 20 MG capsule Take 20 mg by mouth daily.  09/17/16  Yes [provider]  pioglitazone (ACTOS) 30 MG tablet Take 30 mg by mouth every evening.  01/16/16  Yes [provider]  simvastatin (ZOCOR) 40 MG tablet Take 40 mg by mouth every evening.  09/17/16  Yes [provider]  acetaminophen (TYLENOL) 500 MG tablet Take 1-2 tablets (500-1,000 mg total) by mouth every 6 (six) hours as needed for mild pain or fever (pain score 1-3 or temp > 100.5). 02/05/18   Duanne Guess, PA-C  NONFORMULARY OR COMPOUNDED ITEM Trimix (30/1/10)-(Pap/Phent/PGE)  Dosage: Inject 1 cc per injection  Vial 65ml  Qty 1 vial Refills 6  [facility] [phone] Fax [phone] 12/30/18   Hollice Espy, MD    Family History Family History  Problem  Relation Age of Onset  . Prostate cancer Father   . Stroke Father   . Stroke Mother   . Bladder Cancer Neg Hx   . Kidney cancer Neg Hx     Social History Social History   Tobacco Use  . Smoking status: Never Smoker  . Smokeless tobacco: Former Systems developer    Types: Secondary school teacher  . Vaping Use: Never used  Substance Use Topics  . Alcohol use: Yes    Comment: occasionally  . Drug use: No     Allergies   Percocet [oxycodone-acetaminophen] and Vicodin [hydrocodone-acetaminophen]   Review of Systems Review of Systems  Constitutional: Negative.  Negative for activity change, chills and fever.  HENT: Negative.  Negative for  congestion.   Eyes: Negative.  Negative for photophobia, pain and visual disturbance.  Respiratory: Negative.  Negative for cough and shortness of breath.   Cardiovascular: Negative.  Negative for chest pain and palpitations.  Gastrointestinal: Negative.   Genitourinary: Negative.  Negative for dysuria.  Musculoskeletal: Negative.  Negative for back pain, neck pain and neck stiffness.  Skin: Positive for wound. Negative for color change, pallor and rash.  Neurological: Positive for headaches. Negative for dizziness, tremors, seizures, syncope, facial asymmetry, speech difficulty, weakness, light-headedness and numbness.  Hematological: Negative.  Negative for adenopathy. Does not bruise/bleed easily.  All other systems reviewed and are negative.    Physical Exam Triage Vital Signs ED Triage Vitals  Enc Vitals Group     BP 10/13/20 1327 (!) 141/83     Pulse Rate 10/13/20 1327 61     Resp 10/13/20 1327 16     Temp 10/13/20 1327 98 F (36.7 C)     Temp Source 10/13/20 1327 Oral     SpO2 10/13/20 1327 99 %     Weight 10/13/20 1325 196 lb (88.9 kg)     Height 10/13/20 1325 5\' 6"  (1.676 m)     Head Circumference --      Peak Flow --      Pain Score 10/13/20 1325 8     Pain Loc --      Pain Edu? --      Excl. in [place]? --    No data found.  Updated Vital Signs BP (!) 141/83 (BP Location: Left Arm)   Pulse 61   Temp 98 F (36.7 C) (Oral)   Resp 16   Ht 5\' 6"  (1.676 m)   Wt 88.9 kg   SpO2 99%   BMI 31.64 kg/m   Visual Acuity Right Eye Distance:   Left Eye Distance:   Bilateral Distance:    Right Eye Near:   Left Eye Near:    Bilateral Near:     Physical Exam Vitals and nursing note reviewed.  Constitutional:      General: He is not in acute distress.    Appearance: Normal appearance. He is not ill-appearing, toxic-appearing or diaphoretic.  HENT:     Head: Normocephalic. Laceration present. No raccoon eyes.     Jaw: There is normal jaw occlusion. No trismus.      Comments: 8 cm laceration on the top on the head.  TTP.  Minimal bleeding. No step off.  No crepitus.  See picture.    Nose: Nose normal.  Eyes:     General: No visual field deficit or scleral icterus.       Right eye: No discharge.        Left eye: No discharge.  Extraocular Movements: Extraocular movements intact.     Conjunctiva/sclera: Conjunctivae normal.     Pupils: Pupils are equal, round, and reactive to light.  Cardiovascular:     Rate and Rhythm: Normal rate and regular rhythm.     Pulses: Normal pulses.     Heart sounds: Normal heart sounds. No murmur heard. No friction rub. No gallop.   Pulmonary:     Effort: Pulmonary effort is normal. No respiratory distress.     Breath sounds: Normal breath sounds. No stridor. No wheezing, rhonchi or rales.  Musculoskeletal:     Cervical back: Normal range of motion and neck supple. No rigidity or tenderness.  Lymphadenopathy:     Cervical: No cervical adenopathy.  Skin:    General: Skin is warm.     Capillary Refill: Capillary refill takes less than 2 seconds.     Findings: Signs of injury, laceration and wound present. No abscess, ecchymosis or erythema.  Neurological:     General: No focal deficit present.     Mental Status: He is alert and oriented to person, place, and time.     GCS: GCS eye subscore is 4. GCS verbal subscore is 5. GCS motor subscore is 6.     Cranial Nerves: Cranial nerves are intact. No cranial nerve deficit, dysarthria or facial asymmetry.     Sensory: Sensation is intact. No sensory deficit.     Motor: Motor function is intact. No weakness.     Coordination: Coordination is intact. Coordination normal. Finger-Nose-Finger Test normal.     Gait: Gait normal.  Psychiatric:        Mood and Affect: Mood normal.      UC Treatments / Results  Labs (all labs ordered are listed, but only abnormal results are displayed) Labs Reviewed - No data to display  EKG   Radiology No results  found.  Procedures Laceration Repair  Date/Time: 10/15/2020 4:52 PM Performed by: Verda Cumins, MD Authorized by: Verda Cumins, MD   Consent:    Consent obtained:  Verbal   Consent given by:  Patient   Risks, benefits, and alternatives were discussed: yes     Risks discussed:  Infection, nerve damage, need for additional repair, pain, poor cosmetic result, poor wound healing, retained foreign body, tendon damage and vascular damage   Alternatives discussed:  No treatment and referral Universal protocol:    Procedure explained and questions answered to patient or proxy's satisfaction: yes     Immediately prior to procedure, a time out was called: yes     Patient identity confirmed:  Arm band Anesthesia:    Anesthesia method:  None Laceration details:    Location:  Scalp   Scalp location:  Crown   Length (cm):  8   Depth (mm):  2 Pre-procedure details:    Preparation:  Patient was prepped and draped in usual sterile fashion Exploration:    Limited defect created (wound extended): no     Hemostasis achieved with:  Direct pressure   Imaging outcome: foreign body not noted     Wound exploration: entire depth of wound visualized     Contaminated: no   Treatment:    Area cleansed with:  Saline and chlorhexidine   Amount of cleaning:  Standard   Irrigation solution:  Sterile water   Irrigation method:  Syringe   Visualized foreign bodies/material removed: no     Debridement:  None   Undermining:  None   Scar revision: no   Skin repair:  Repair method:  Staples   Number of staples:  8 Approximation:    Approximation:  Close Repair type:    Repair type:  Simple Post-procedure details:    Dressing:  Open (no dressing)   Procedure completion:  Tolerated well, no immediate complications   (including critical care time)  Medications Ordered in UC Medications - No data to display  Initial Impression / Assessment and Plan / UC Course  I have reviewed the triage vital  signs and the nursing notes.  Pertinent labs & imaging results that were available during my care of the patient were reviewed by me and considered in my medical decision making (see chart for details).  Clinical impression: Laceration to the crown of the head with bleeding.  No loss of consciousness.  No concussion symptoms.  Treatment plan: 1.  The findings and treatment plan were discussed in detail with the patient.  Patient was in agreement. 2.  Discussed doing sutures but felt staples was a better option.  Patient provided verbal consent.  Risks and benefits were explained in detail.  Area was cleaned and draped in usual sterile fashion.  I placed 8 staples in the crown of the head.  Please see procedure above. 3.  Educational handout was provided. 4.  Red flag signs and symptoms were discussed in detail and when to go to the emergency room.  These included but were not limited to a significant headache, vision changes, or changes in behavior.  Tylenol for any discomfort.  Asked him not to take any anti-inflammatories or aspirin products.  Positional increases bleeding risk.  He voiced verbal understanding. 5.  I asked him to come back in 10 to 14 days for staple removal.  He will make that appointment when he leaves.    Final Clinical Impressions(s) / UC Diagnoses   Final diagnoses:  Laceration of scalp, initial encounter  Minor head injury, initial encounter     Discharge Instructions     You sustained a quite a large laceration to the scalp.  I put 8 staples in there.  Please see the educational handout for wound care.  Please return to the clinic in 10 to 14 days for staple removal.  If you develop any significant pain, redness, warmth, fever, or pustular drainage please come back here, see your primary care physician, or go to the emergency room. If you develop any significant headache, vision changes, or changes in behavior, please go to the emergency room. Please use Tylenol  only for any pain that you are having.  Please avoid any anti-inflammatory medications such as Advil, Motrin, ibuprofen, Naprosyn, Aleve, or aspirin products.    ED Prescriptions    None     PDMP not reviewed this encounter.   Verda Cumins, MD 10/15/20 1659

## 2020-10-13 NOTE — Discharge Instructions (Addendum)
You sustained a quite a large laceration to the scalp.  I put 8 staples in there.  Please see the educational handout for wound care.  Please return to the clinic in 10 to 14 days for staple removal.  If you develop any significant pain, redness, warmth, fever, or pustular drainage please come back here, see your primary care physician, or go to the emergency room. If you develop any significant headache, vision changes, or changes in behavior, please go to the emergency room. Please use Tylenol only for any pain that you are having.  Please avoid any anti-inflammatory medications such as Advil, Motrin, ibuprofen, Naprosyn, Aleve, or aspirin products.

## 2020-10-13 NOTE — ED Triage Notes (Signed)
Patient states that he hit his head on a rafter in an old house today.  Patient denies any LOC.  Patient has a laceration to the top of his head with minimal bleeding.

## 2020-10-13 NOTE — ED Provider Notes (Signed)
      Danton Clap, PA-C 10/13/20 1442

## 2020-10-15 DIAGNOSIS — S0101XA Laceration without foreign body of scalp, initial encounter: Secondary | ICD-10-CM | POA: Diagnosis not present

## 2020-10-26 ENCOUNTER — Ambulatory Visit
Admission: RE | Admit: 2020-10-26 | Discharge: 2020-10-26 | Disposition: A | Discharge status: Home / Self Care | Payer: BC Managed Care – PPO | Source: Ambulatory Visit | Attending: Internal Medicine | Admitting: Internal Medicine

## 2020-10-26 ENCOUNTER — Other Ambulatory Visit: Payer: Self-pay

## 2020-10-26 NOTE — ED Triage Notes (Signed)
Pt presents to [facility] for staple removal. He had 7 staples place on top of his head on 10/13/20. Wound is healing well.

## 2021-01-09 ENCOUNTER — Other Ambulatory Visit: Payer: Self-pay

## 2021-01-09 ENCOUNTER — Other Ambulatory Visit: Payer: BC Managed Care – PPO

## 2021-01-09 DIAGNOSIS — C61 Malignant neoplasm of prostate: Secondary | ICD-10-CM

## 2021-01-10 LAB — PSA: Prostate Specific Ag, Serum: <0.1 ng/mL (ref 0.0–4.0)

## 2021-01-16 ENCOUNTER — Ambulatory Visit: Payer: Self-pay | Admitting: Urology

## 2021-01-16 ENCOUNTER — Encounter: Payer: Self-pay | Admitting: Urology

## 2021-01-16 ENCOUNTER — Telehealth: Payer: Self-pay | Admitting: *Deleted

## 2021-01-16 NOTE — Telephone Encounter (Addendum)
Patient informed, scheduled follow up   ----- Message from Hollice Espy, MD sent at 01/16/2021  4:15 PM EDT ----- Please let this patient know that his PSA remains undetectable which is excellent.  He missed his f/u today.  He needs to have this checked annually which can be done by [place] or his primary care physician.  If it ever becomes anything above less than 0.1, he needs to be reevaluated.  We can go ahead and schedule his follow-up for next year with PSA prior if he does not have any other issues he would like to discuss with me this year.  Hollice Espy, MD

## 2021-04-10 ENCOUNTER — Other Ambulatory Visit: Payer: Self-pay | Admitting: Physician Assistant

## 2021-04-10 DIAGNOSIS — M7541 Impingement syndrome of right shoulder: Secondary | ICD-10-CM

## 2021-04-12 ENCOUNTER — Other Ambulatory Visit: Payer: Self-pay

## 2021-04-12 ENCOUNTER — Ambulatory Visit
Admission: RE | Admit: 2021-04-12 | Discharge: 2021-04-12 | Disposition: A | Discharge status: Home / Self Care | Payer: BC Managed Care – PPO | Source: Ambulatory Visit | Attending: Physician Assistant | Admitting: Physician Assistant

## 2021-04-12 DIAGNOSIS — M7541 Impingement syndrome of right shoulder: Secondary | ICD-10-CM | POA: Diagnosis not present

## 2021-09-17 ENCOUNTER — Other Ambulatory Visit: Payer: Self-pay | Admitting: Surgery

## 2021-09-30 ENCOUNTER — Encounter
Admission: RE | Admit: 2021-09-30 | Discharge: 2021-09-30 | Disposition: A | Discharge status: Home / Self Care | Payer: BC Managed Care – PPO | Source: Ambulatory Visit | Attending: Surgery | Admitting: Surgery

## 2021-09-30 ENCOUNTER — Other Ambulatory Visit: Payer: Self-pay

## 2021-09-30 DIAGNOSIS — D649 Anemia, unspecified: Secondary | ICD-10-CM

## 2021-09-30 DIAGNOSIS — E119 Type 2 diabetes mellitus without complications: Secondary | ICD-10-CM

## 2021-09-30 HISTORY — DX: Family history of other specified conditions: Z84.89

## 2021-09-30 HISTORY — DX: Anemia, unspecified: D64.9

## 2021-09-30 NOTE — Patient Instructions (Signed)
Your procedure is scheduled on:10-03-21 Thursday ?Report to the Registration Desk on the 1st floor of the [facility].Then proceed to the 2nd floor Surgery Desk in the [facility] ?To find out your arrival time, please call [phone] between 1PM - 3PM on:10-02-21 Wednesday ? ?REMEMBER: ?Instructions that are not followed completely may result in serious medical risk, up to and including death; or upon the discretion of your surgeon and anesthesiologist your surgery may need to be rescheduled. ? ?Do not eat food after midnight the night before surgery.  ?No gum chewing, lozengers or hard candies. ? ?You may however, drink Water up to 2 hours before you are scheduled to arrive for your surgery. Do not drink anything within 2 hours of your scheduled arrival time. ? ?Type 1 and Type 2 diabetics should only drink water. ? ?In addition, your doctor has ordered for you to drink the provided  ?Gatorade G2 ?Drinking this carbohydrate drink up to two hours before surgery helps to reduce insulin resistance and improve patient outcomes. Please complete drinking 2 hours prior to scheduled arrival time. ? ?TAKE THESE MEDICATIONS THE MORNING OF SURGERY WITH A SIP OF WATER: ?-omeprazole (PRILOSEC)  ? ?Stop your metFORMIN (GLUCOPHAGE-XR) 2 days prior to surgery-Last dose 09-30-21 Monday ? ?Take half of your insulin degludec (TRESIBA) (13 units) the night before your surgery (Wednesday night) and NO insulin the morning of surgery ? ?One week prior to surgery: ?Stop Anti-inflammatories (NSAIDS) such as Advil, Aleve, Ibuprofen, Motrin, Naproxen, Naprosyn and Aspirin based products such as Excedrin, Goodys Powder, BC Powder.You may however, take Tylenol if needed for pain up until the day of surgery. ? ?Stop ANY OVER THE COUNTER supplements/vitamins NOW (09-30-21) until after surgery. ? ?No Alcohol for 24 hours before or after surgery. ? ?No Smoking including e-cigarettes for 24 hours prior to surgery.  ?No chewable tobacco products  for at least 6 hours prior to surgery.  ?No nicotine patches on the day of surgery. ? ?Do not use any "recreational" drugs for at least a week prior to your surgery.  ?Please be advised that the combination of cocaine and anesthesia may have negative outcomes, up to and including death. ?If you test positive for cocaine, your surgery will be cancelled. ? ?On the morning of surgery brush your teeth with toothpaste and water, you may rinse your mouth with mouthwash if you wish. ?Do not swallow any toothpaste or mouthwash. ? ?Use CHG Soap as directed on instruction sheet. ? ?Do not wear jewelry, make-up, hairpins, clips or nail polish. ? ?Do not wear lotions, powders, or perfumes.  ? ?Do not shave body from the neck down 48 hours prior to surgery just in case you cut yourself which could leave a site for infection.  ?Also, freshly shaved skin may become irritated if using the CHG soap. ? ?Contact lenses, hearing aids and dentures may not be worn into surgery. ? ?Do not bring valuables to the hospital. [facility] is not responsible for any missing/lost belongings or valuables.  ? ?Notify your doctor if there is any change in your medical condition (cold, fever, infection). ? ?Wear comfortable clothing (specific to your surgery type) to the hospital. ? ?After surgery, you can help prevent lung complications by doing breathing exercises.  ?Take deep breaths and cough every 1-2 hours. Your doctor may order a device called an Incentive Spirometer to help you take deep breaths. ?When coughing or sneezing, hold a pillow firmly against your incision with both hands. This is called ?  splinting.? Doing this helps protect your incision. It also decreases belly discomfort. ? ?If you are being admitted to the hospital overnight, leave your suitcase in the car. ?After surgery it may be brought to your room. ? ?If you are being discharged the day of surgery, you will not be allowed to drive home. ?You will need a responsible adult  (18 years or older) to drive you home and stay with you that night.  ? ?If you are taking public transportation, you will need to have a responsible adult (18 years or older) with you. ?Please confirm with your physician that it is acceptable to use public transportation.  ? ?Please call the [facility] at [phone] if you have any questions about these instructions. ? ?Surgery Visitation Policy: ? ?Patients undergoing a surgery or procedure may have one family member or support person with them as long as that person is not COVID-19 positive or experiencing its symptoms.  ?That person may remain in the waiting area during the procedure and may rotate out with other people. ?

## 2021-10-01 ENCOUNTER — Encounter
Admission: RE | Admit: 2021-10-01 | Discharge: 2021-10-01 | Disposition: A | Discharge status: Home / Self Care | Payer: Medicare PPO | Source: Ambulatory Visit | Attending: Surgery | Admitting: Surgery

## 2021-10-01 DIAGNOSIS — D649 Anemia, unspecified: Secondary | ICD-10-CM

## 2021-10-01 DIAGNOSIS — Z01818 Encounter for other preprocedural examination: Secondary | ICD-10-CM | POA: Insufficient documentation

## 2021-10-01 DIAGNOSIS — E119 Type 2 diabetes mellitus without complications: Secondary | ICD-10-CM | POA: Diagnosis not present

## 2021-10-01 DIAGNOSIS — Z794 Long term (current) use of insulin: Secondary | ICD-10-CM | POA: Insufficient documentation

## 2021-10-01 DIAGNOSIS — Z01810 Encounter for preprocedural cardiovascular examination: Secondary | ICD-10-CM | POA: Diagnosis not present

## 2021-10-01 LAB — BASIC METABOLIC PANEL
Anion gap: 7 (ref 5–15)
BUN: 14 mg/dL (ref 8–23)
CO2: 28 mmol/L (ref 22–32)
Calcium: 9.2 mg/dL (ref 8.9–10.3)
Chloride: 103 mmol/L (ref 98–111)
Creatinine, Ser: 0.8 mg/dL (ref 0.61–1.24)
GFR, Estimated: >60 mL/min (ref >60)
Glucose, Bld: 149 mg/dL — ABNORMAL HIGH (ref 70–99)
Potassium: 4.4 mmol/L (ref 3.5–5.1)
Sodium: 138 mmol/L (ref 135–145)

## 2021-10-01 LAB — CBC
HCT: 44.4 % (ref 39.0–52.0)
Hemoglobin: 14.2 g/dL (ref 13.0–17.0)
MCH: 29.7 pg (ref 26.0–34.0)
MCHC: 32 g/dL (ref 30.0–36.0)
MCV: 92.9 fL (ref 80.0–100.0)
Platelets: 247 10*3/uL (ref 150–400)
RBC: 4.78 MIL/uL (ref 4.22–5.81)
RDW: 13.4 % (ref 11.5–15.5)
WBC: 5.6 10*3/uL (ref 4.0–10.5)
nRBC: 0 % (ref 0.0–0.2)

## 2021-10-03 ENCOUNTER — Ambulatory Visit: Payer: Medicare PPO | Admitting: Urgent Care

## 2021-10-03 ENCOUNTER — Ambulatory Visit: Payer: Medicare PPO | Admitting: Anesthesiology

## 2021-10-03 ENCOUNTER — Encounter
Admission: RE | Disposition: A | Discharge status: Home / Self Care | Payer: Self-pay | Source: Home / Self Care | Attending: Surgery

## 2021-10-03 ENCOUNTER — Encounter: Payer: Self-pay | Admitting: Surgery

## 2021-10-03 ENCOUNTER — Other Ambulatory Visit: Payer: Self-pay

## 2021-10-03 ENCOUNTER — Ambulatory Visit: Payer: Medicare PPO

## 2021-10-03 ENCOUNTER — Ambulatory Visit
Admission: RE | Admit: 2021-10-03 | Discharge: 2021-10-03 | Disposition: A | Discharge status: Home / Self Care | Payer: Medicare PPO | Attending: Surgery | Admitting: Surgery

## 2021-10-03 DIAGNOSIS — Z794 Long term (current) use of insulin: Secondary | ICD-10-CM | POA: Diagnosis not present

## 2021-10-03 DIAGNOSIS — Z87891 Personal history of nicotine dependence: Secondary | ICD-10-CM | POA: Diagnosis not present

## 2021-10-03 DIAGNOSIS — E669 Obesity, unspecified: Secondary | ICD-10-CM | POA: Insufficient documentation

## 2021-10-03 DIAGNOSIS — Z8546 Personal history of malignant neoplasm of prostate: Secondary | ICD-10-CM | POA: Diagnosis not present

## 2021-10-03 DIAGNOSIS — E119 Type 2 diabetes mellitus without complications: Secondary | ICD-10-CM | POA: Insufficient documentation

## 2021-10-03 DIAGNOSIS — M7501 Adhesive capsulitis of right shoulder: Secondary | ICD-10-CM | POA: Diagnosis not present

## 2021-10-03 DIAGNOSIS — K219 Gastro-esophageal reflux disease without esophagitis: Secondary | ICD-10-CM | POA: Diagnosis not present

## 2021-10-03 DIAGNOSIS — M7521 Bicipital tendinitis, right shoulder: Secondary | ICD-10-CM | POA: Diagnosis not present

## 2021-10-03 DIAGNOSIS — M75121 Complete rotator cuff tear or rupture of right shoulder, not specified as traumatic: Secondary | ICD-10-CM | POA: Diagnosis present

## 2021-10-03 DIAGNOSIS — Z6832 Body mass index (BMI) 32.0-32.9, adult: Secondary | ICD-10-CM | POA: Insufficient documentation

## 2021-10-03 HISTORY — PX: SHOULDER ARTHROSCOPY WITH SUBACROMIAL DECOMPRESSION, ROTATOR CUFF REPAIR AND BICEP TENDON REPAIR: SHX5687

## 2021-10-03 LAB — GLUCOSE, CAPILLARY
Glucose-Capillary: 189 mg/dL — ABNORMAL HIGH (ref 70–99)
Glucose-Capillary: 172 mg/dL — ABNORMAL HIGH (ref 70–99)

## 2021-10-03 SURGERY — SHOULDER ARTHROSCOPY WITH SUBACROMIAL DECOMPRESSION, ROTATOR CUFF REPAIR AND BICEP TENDON REPAIR
Anesthesia: Regional | Site: Shoulder | Laterality: Right

## 2021-10-03 MED ORDER — FENTANYL CITRATE PF 50 MCG/ML IJ SOSY: 50.0000 ug | PREFILLED_SYRINGE | Freq: Once | INTRAMUSCULAR | Status: AC

## 2021-10-03 MED ORDER — LACTATED RINGERS IR SOLN
Status: DC | PRN
  Administered 2021-10-03: 3001 mL

## 2021-10-03 MED ORDER — BUPIVACAINE-EPINEPHRINE 0.5% -1:200000 IJ SOLN
INTRAMUSCULAR | Status: DC | PRN
  Administered 2021-10-03: 30 mL

## 2021-10-03 MED ORDER — ONDANSETRON 4 MG PO TBDP: 4.0000 mg | ORAL_TABLET | Freq: Three times a day (TID) | ORAL | 0 refills | Status: DC | PRN

## 2021-10-03 MED ORDER — SCOPOLAMINE 1 MG/3DAYS TD PT72
1.0000 | MEDICATED_PATCH | TRANSDERMAL | Status: DC
  Administered 2021-10-03: 1.5 mg via TRANSDERMAL

## 2021-10-03 MED ORDER — LIDOCAINE HCL (PF) 2 % IJ SOLN
INTRAMUSCULAR | Status: AC
  Filled 2021-10-03: qty 5

## 2021-10-03 MED ORDER — CHLORHEXIDINE GLUCONATE 0.12 % MT SOLN
OROMUCOSAL | Status: AC
  Filled 2021-10-03: qty 15

## 2021-10-03 MED ORDER — APREPITANT 40 MG PO CAPS
ORAL_CAPSULE | ORAL | Status: AC
  Filled 2021-10-03: qty 1

## 2021-10-03 MED ORDER — KETOROLAC TROMETHAMINE 15 MG/ML IJ SOLN: 15.0000 mg | Freq: Four times a day (QID) | INTRAMUSCULAR | Status: DC

## 2021-10-03 MED ORDER — ONDANSETRON HCL 4 MG/2ML IJ SOLN
INTRAMUSCULAR | Status: DC | PRN
Start: 2021-10-03 — End: 2021-10-03
  Administered 2021-10-03: 4 mg via INTRAVENOUS

## 2021-10-03 MED ORDER — CHLORHEXIDINE GLUCONATE 0.12 % MT SOLN
15.0000 mL | Freq: Once | OROMUCOSAL | Status: AC
  Administered 2021-10-03: 15 mL via OROMUCOSAL

## 2021-10-03 MED ORDER — FENTANYL CITRATE (PF) 100 MCG/2ML IJ SOLN: 25.0000 ug | INTRAMUSCULAR | Status: DC | PRN

## 2021-10-03 MED ORDER — SODIUM CHLORIDE 0.9 % IV SOLN: INTRAVENOUS | Status: DC

## 2021-10-03 MED ORDER — FENTANYL CITRATE (PF) 100 MCG/2ML IJ SOLN
INTRAMUSCULAR | Status: AC
  Filled 2021-10-03: qty 2

## 2021-10-03 MED ORDER — ROCURONIUM BROMIDE 10 MG/ML (PF) SYRINGE
PREFILLED_SYRINGE | INTRAVENOUS | Status: AC
  Filled 2021-10-03: qty 10

## 2021-10-03 MED ORDER — BUPIVACAINE HCL (PF) 0.5 % IJ SOLN
INTRAMUSCULAR | Status: DC | PRN
  Administered 2021-10-03: 10 mL

## 2021-10-03 MED ORDER — ONDANSETRON HCL 4 MG PO TABS: 4.0000 mg | ORAL_TABLET | Freq: Four times a day (QID) | ORAL | Status: DC | PRN

## 2021-10-03 MED ORDER — ROCURONIUM BROMIDE 100 MG/10ML IV SOLN
INTRAVENOUS | Status: DC | PRN
  Administered 2021-10-03: 50 mg via INTRAVENOUS
  Administered 2021-10-03: 10 mg via INTRAVENOUS

## 2021-10-03 MED ORDER — KETOROLAC TROMETHAMINE 30 MG/ML IJ SOLN
30.0000 mg | Freq: Once | INTRAMUSCULAR | Status: DC
Start: 2021-10-03 — End: 2021-10-03

## 2021-10-03 MED ORDER — FENTANYL CITRATE (PF) 100 MCG/2ML IJ SOLN
INTRAMUSCULAR | Status: DC | PRN
  Administered 2021-10-03: 50 ug via INTRAVENOUS

## 2021-10-03 MED ORDER — PROPOFOL 10 MG/ML IV BOLUS
INTRAVENOUS | Status: DC | PRN
Start: 2021-10-03 — End: 2021-10-03
  Administered 2021-10-03: 150 mg via INTRAVENOUS

## 2021-10-03 MED ORDER — ACETAMINOPHEN 10 MG/ML IV SOLN
INTRAVENOUS | Status: DC | PRN
  Administered 2021-10-03: 1000 mg via INTRAVENOUS

## 2021-10-03 MED ORDER — ACETAMINOPHEN 500 MG PO TABS: 1000.0000 mg | ORAL_TABLET | Freq: Four times a day (QID) | ORAL | Status: DC

## 2021-10-03 MED ORDER — ACETAMINOPHEN 10 MG/ML IV SOLN
INTRAVENOUS | Status: AC
  Filled 2021-10-03: qty 100

## 2021-10-03 MED ORDER — LACTATED RINGERS IV SOLN: INTRAVENOUS | Status: DC

## 2021-10-03 MED ORDER — BUPIVACAINE LIPOSOME 1.3 % IJ SUSP
20.0000 mL | Freq: Once | INTRAMUSCULAR | Status: AC
  Administered 2021-10-03: 20 mL

## 2021-10-03 MED ORDER — CEFAZOLIN SODIUM-DEXTROSE 2-4 GM/100ML-% IV SOLN
2.0000 g | INTRAVENOUS | Status: AC
  Administered 2021-10-03: 2 g via INTRAVENOUS

## 2021-10-03 MED ORDER — KETOROLAC TROMETHAMINE 30 MG/ML IJ SOLN
INTRAMUSCULAR | Status: AC
  Filled 2021-10-03: qty 1

## 2021-10-03 MED ORDER — APREPITANT 40 MG PO CAPS
40.0000 mg | ORAL_CAPSULE | Freq: Once | ORAL | Status: AC
  Administered 2021-10-03: 40 mg via ORAL

## 2021-10-03 MED ORDER — TRAMADOL HCL 50 MG PO TABS: 50.0000 mg | ORAL_TABLET | ORAL | 0 refills | Status: DC | PRN

## 2021-10-03 MED ORDER — PHENYLEPHRINE HCL (PRESSORS) 10 MG/ML IV SOLN
INTRAVENOUS | Status: AC
Start: 2021-10-03 — End: ?
  Filled 2021-10-03: qty 1

## 2021-10-03 MED ORDER — KETOROLAC TROMETHAMINE 30 MG/ML IJ SOLN
INTRAMUSCULAR | Status: DC | PRN
  Administered 2021-10-03: 30 mg via INTRAVENOUS

## 2021-10-03 MED ORDER — BUPIVACAINE-EPINEPHRINE (PF) 0.5% -1:200000 IJ SOLN
INTRAMUSCULAR | Status: AC
  Filled 2021-10-03: qty 30

## 2021-10-03 MED ORDER — METOCLOPRAMIDE HCL 10 MG PO TABS: 5.0000 mg | ORAL_TABLET | Freq: Three times a day (TID) | ORAL | Status: DC | PRN

## 2021-10-03 MED ORDER — PROPOFOL 1000 MG/100ML IV EMUL
INTRAVENOUS | Status: AC
  Filled 2021-10-03: qty 100

## 2021-10-03 MED ORDER — BUPIVACAINE HCL (PF) 0.5 % IJ SOLN
INTRAMUSCULAR | Status: AC
  Filled 2021-10-03: qty 10

## 2021-10-03 MED ORDER — METOCLOPRAMIDE HCL 5 MG/ML IJ SOLN: 5.0000 mg | Freq: Three times a day (TID) | INTRAMUSCULAR | Status: DC | PRN

## 2021-10-03 MED ORDER — PROPOFOL 500 MG/50ML IV EMUL
INTRAVENOUS | Status: DC | PRN
  Administered 2021-10-03: 150 ug/kg/min via INTRAVENOUS

## 2021-10-03 MED ORDER — MIDAZOLAM HCL 2 MG/2ML IJ SOLN
2.0000 mg | Freq: Once | INTRAMUSCULAR | Status: AC
Start: 2021-10-03 — End: 2021-10-03

## 2021-10-03 MED ORDER — PROPOFOL 1000 MG/100ML IV EMUL
INTRAVENOUS | Status: AC
Start: 2021-10-03 — End: ?
  Filled 2021-10-03: qty 100

## 2021-10-03 MED ORDER — PHENYLEPHRINE HCL (PRESSORS) 10 MG/ML IV SOLN
INTRAVENOUS | Status: AC
  Filled 2021-10-03: qty 1

## 2021-10-03 MED ORDER — BUPIVACAINE LIPOSOME 1.3 % IJ SUSP
INTRAMUSCULAR | Status: AC
  Filled 2021-10-03: qty 20

## 2021-10-03 MED ORDER — TRAMADOL HCL 50 MG PO TABS: 50.0000 mg | ORAL_TABLET | Freq: Four times a day (QID) | ORAL | Status: DC | PRN

## 2021-10-03 MED ORDER — EPHEDRINE 5 MG/ML INJ
INTRAVENOUS | Status: AC
  Filled 2021-10-03: qty 5

## 2021-10-03 MED ORDER — EPHEDRINE SULFATE (PRESSORS) 50 MG/ML IJ SOLN
INTRAMUSCULAR | Status: DC | PRN
Start: 2021-10-03 — End: 2021-10-03
  Administered 2021-10-03 (×2): 5 mg via INTRAVENOUS

## 2021-10-03 MED ORDER — ORAL CARE MOUTH RINSE: 15.0000 mL | Freq: Once | OROMUCOSAL | Status: AC

## 2021-10-03 MED ORDER — PROPOFOL 10 MG/ML IV BOLUS
INTRAVENOUS | Status: AC
  Filled 2021-10-03: qty 20

## 2021-10-03 MED ORDER — EPINEPHRINE PF 1 MG/ML IJ SOLN
INTRAMUSCULAR | Status: AC
  Filled 2021-10-03: qty 1

## 2021-10-03 MED ORDER — MIDAZOLAM HCL 2 MG/2ML IJ SOLN
INTRAMUSCULAR | Status: AC
  Administered 2021-10-03: 2 mg via INTRAVENOUS
  Filled 2021-10-03: qty 2

## 2021-10-03 MED ORDER — LIDOCAINE HCL (CARDIAC) PF 100 MG/5ML IV SOSY
PREFILLED_SYRINGE | INTRAVENOUS | Status: DC | PRN
  Administered 2021-10-03: 80 mg via INTRAVENOUS

## 2021-10-03 MED ORDER — FENTANYL CITRATE PF 50 MCG/ML IJ SOSY
PREFILLED_SYRINGE | INTRAMUSCULAR | Status: AC
  Administered 2021-10-03: 50 ug via INTRAVENOUS
  Filled 2021-10-03: qty 1

## 2021-10-03 MED ORDER — SUGAMMADEX SODIUM 200 MG/2ML IV SOLN
INTRAVENOUS | Status: DC | PRN
  Administered 2021-10-03: 200 mg via INTRAVENOUS

## 2021-10-03 MED ORDER — SCOPOLAMINE 1 MG/3DAYS TD PT72
MEDICATED_PATCH | TRANSDERMAL | Status: AC
  Filled 2021-10-03: qty 1

## 2021-10-03 MED ORDER — ACETAMINOPHEN 10 MG/ML IV SOLN: 1000.0000 mg | Freq: Once | INTRAVENOUS | Status: DC | PRN

## 2021-10-03 MED ORDER — CEFAZOLIN SODIUM-DEXTROSE 2-4 GM/100ML-% IV SOLN
INTRAVENOUS | Status: AC
  Filled 2021-10-03: qty 100

## 2021-10-03 MED ORDER — PHENYLEPHRINE HCL-NACL 20-0.9 MG/250ML-% IV SOLN
INTRAVENOUS | Status: DC | PRN
  Administered 2021-10-03: 30 ug/min via INTRAVENOUS

## 2021-10-03 MED ORDER — ONDANSETRON HCL 4 MG/2ML IJ SOLN: 4.0000 mg | Freq: Four times a day (QID) | INTRAMUSCULAR | Status: DC | PRN

## 2021-10-03 MED ORDER — PHENYLEPHRINE 40 MCG/ML (10ML) SYRINGE FOR IV PUSH (FOR BLOOD PRESSURE SUPPORT): PREFILLED_SYRINGE | INTRAVENOUS | Status: DC | PRN

## 2021-10-03 MED ORDER — ONDANSETRON HCL 4 MG/2ML IJ SOLN: 4.0000 mg | Freq: Once | INTRAMUSCULAR | Status: DC | PRN

## 2021-10-03 SURGICAL SUPPLY — 55 items
ANCH SUT 2 JK 1.5X2.9 2 LD (Anchor) ×1 IMPLANT
ANCH SUT 5.5 KNTLS (Anchor) ×2 IMPLANT
ANCH SUT Q-FX 2.8 (Anchor) ×2 IMPLANT
ANCHOR ALL-SUT Q-FIX 2.8 (Anchor) ×2 IMPLANT
ANCHOR HEALICOIL REGEN 5.5 (Anchor) ×2 IMPLANT
ANCHOR SUT JK SZ 2 2.9 DBL SL (Anchor) ×1 IMPLANT
APL PRP STRL LF DISP 70% ISPRP (MISCELLANEOUS) ×1
BIT DRILL JUGRKNT W/NDL BIT2.9 (DRILL) IMPLANT
BLADE FULL RADIUS 3.5 (BLADE) ×2 IMPLANT
BUR ACROMIONIZER 4.0 (BURR) ×2 IMPLANT
CANNULA SHAVER 8MMX76MM (CANNULA) ×2 IMPLANT
CHLORAPREP W/TINT 26 (MISCELLANEOUS) ×2 IMPLANT
COVER MAYO STAND REUSABLE (DRAPES) ×2 IMPLANT
DILATOR 5.5 THREADED HEALICOIL (MISCELLANEOUS) ×1 IMPLANT
DRILL JUGGERKNOT W/NDL BIT 2.9 (DRILL) ×2
ELECT CAUTERY BLADE 6.4 (BLADE) ×2 IMPLANT
ELECT REM PT RETURN 9FT ADLT (ELECTROSURGICAL) ×2
ELECTRODE REM PT RTRN 9FT ADLT (ELECTROSURGICAL) ×1 IMPLANT
GAUZE SPONGE 4X4 12PLY STRL (GAUZE/BANDAGES/DRESSINGS) ×2 IMPLANT
GAUZE XEROFORM 1X8 LF (GAUZE/BANDAGES/DRESSINGS) ×2 IMPLANT
GLOVE SRG 8 PF TXTR STRL LF DI (GLOVE) ×1 IMPLANT
GLOVE SURG ENC MOIS LTX SZ7.5 (GLOVE) ×4 IMPLANT
GLOVE SURG ENC MOIS LTX SZ8 (GLOVE) ×4 IMPLANT
GLOVE SURG UNDER LTX SZ8 (GLOVE) ×2 IMPLANT
GLOVE SURG UNDER POLY LF SZ8 (GLOVE) ×2
GOWN STRL REUS W/ TWL LRG LVL3 (GOWN DISPOSABLE) ×1 IMPLANT
GOWN STRL REUS W/ TWL XL LVL3 (GOWN DISPOSABLE) ×1 IMPLANT
GOWN STRL REUS W/TWL LRG LVL3 (GOWN DISPOSABLE) ×2
GOWN STRL REUS W/TWL XL LVL3 (GOWN DISPOSABLE) ×2
GRASPER SUT 15 45D LOW PRO (SUTURE) IMPLANT
IV LACTATED RINGER IRRG 3000ML (IV SOLUTION) ×4
IV LR IRRIG 3000ML ARTHROMATIC (IV SOLUTION) ×2 IMPLANT
KIT CANNULA 8X76-LX IN CANNULA (CANNULA) IMPLANT
KIT SUTURE 2.8 Q-FIX DISP (MISCELLANEOUS) ×1 IMPLANT
MANIFOLD NEPTUNE II (INSTRUMENTS) ×4 IMPLANT
MASK FACE SPIDER DISP (MASK) ×2 IMPLANT
MAT ABSORB  FLUID 56X50 GRAY (MISCELLANEOUS) ×1
MAT ABSORB FLUID 56X50 GRAY (MISCELLANEOUS) ×1 IMPLANT
PACK ARTHROSCOPY SHOULDER (MISCELLANEOUS) ×2 IMPLANT
PAD ABD DERMACEA PRESS 5X9 (GAUZE/BANDAGES/DRESSINGS) ×4 IMPLANT
PASSER SUT FIRSTPASS SELF (INSTRUMENTS) ×1 IMPLANT
SLING ARM LRG DEEP (SOFTGOODS) ×1 IMPLANT
SLING ULTRA II LG (MISCELLANEOUS) ×2 IMPLANT
SPONGE T-LAP 18X18 ~~LOC~~+RFID (SPONGE) ×2 IMPLANT
STAPLER SKIN PROX 35W (STAPLE) ×2 IMPLANT
STRAP SAFETY 5IN WIDE (MISCELLANEOUS) ×2 IMPLANT
SUT ETHIBOND 0 MO6 C/R (SUTURE) ×2 IMPLANT
SUT ULTRABRAID 2 COBRAID 38 (SUTURE) ×1 IMPLANT
SUT VIC AB 2-0 CT1 27 (SUTURE) ×4
SUT VIC AB 2-0 CT1 TAPERPNT 27 (SUTURE) ×2 IMPLANT
TAPE MICROFOAM 4IN (TAPE) ×2 IMPLANT
TUBING CONNECTING 10 (TUBING) ×2 IMPLANT
TUBING INFLOW SET DBFLO PUMP (TUBING) ×2 IMPLANT
WAND WEREWOLF FLOW 90D (MISCELLANEOUS) ×2 IMPLANT
WATER STERILE IRR 500ML POUR (IV SOLUTION) ×2 IMPLANT

## 2021-10-03 NOTE — Anesthesia Procedure Notes (Signed)
Procedure Name: Intubation ?Date/Time: 10/03/2021 2:09 PM ?Performed by: Hedda Slade, CRNA ?Pre-anesthesia Checklist: Patient identified, Patient being monitored, Timeout performed, Emergency Drugs available and Suction available ?Patient Re-evaluated:Patient Re-evaluated prior to induction ?Oxygen Delivery Method: Circle system utilized ?Preoxygenation: Pre-oxygenation with 100% oxygen ?Induction Type: IV induction ?Ventilation: Mask ventilation without difficulty ?Laryngoscope Size: 4 and McGraph ?Grade View: Grade I ?Tube type: Oral ?Tube size: 7.5 mm ?Number of attempts: 1 ?Airway Equipment and Method: Stylet ?Placement Confirmation: ETT inserted through vocal cords under direct vision, positive ETCO2 and breath sounds checked- equal and bilateral ?Secured at: 22 cm ?Tube secured with: Tape ?Dental Injury: Teeth and Oropharynx as per pre-operative assessment  ? ? ? ? ?

## 2021-10-03 NOTE — Anesthesia Preprocedure Evaluation (Addendum)
Anesthesia Evaluation  ?Patient identified by MRN, date of birth, ID band ?Patient awake ? ? ? ?Reviewed: ?Allergy & Precautions, NPO status , Patient's Chart, lab work & pertinent test results ? ?History of Anesthesia Complications ?(+) PONV and history of anesthetic complications ? ?Airway ?Mallampati: III ? ? ?Neck ROM: Full ? ? ? Dental ? ? ?Missing molars and upper left front tooth; upper front teeth chipped:   ?Pulmonary ?former smoker (quit 1991),  ?  ?Pulmonary exam normal ?breath sounds clear to auscultation ? ? ? ? ? ? Cardiovascular ?Exercise Tolerance: Good ?Normal cardiovascular exam ?Rhythm:Regular Rate:Normal ? ?ECG 10/01/21:  ?Sinus rhythm with Premature supraventricular complexes ?Otherwise normal ECG ?  ?Neuro/Psych ?Hx [place] 2000; vertigo ?  ? GI/Hepatic ?GERD  ,  ?Endo/Other  ?diabetes, Type 2, Insulin DependentObesity  ? Renal/GU ?negative Renal ROS  ? ?Prostate CA ? ?  ?Musculoskeletal ? ? Abdominal ?  ?Peds ? Hematology ? ?(+) Blood dyscrasia, anemia ,   ?Anesthesia Other Findings ? ? Reproductive/Obstetrics ? ?  ? ? ? ? ? ? ? ? ? ? ? ? ? ?  ?  ? ? ? ? ? ? ? ?Anesthesia Physical ?Anesthesia Plan ? ?ASA: 3 ? ?Anesthesia Plan: General and Regional  ? ?Post-op Pain Management: Regional block*  ? ?Induction: Intravenous ? ?PONV Risk Score and Plan: 3 and Ondansetron, Dexamethasone, Treatment may vary due to age or medical condition and Scopolamine patch - Pre-op ? ?Airway Management Planned: Oral ETT ? ?Additional Equipment:  ? ?Intra-op Plan:  ? ?Post-operative Plan: Extubation in OR ? ?Informed Consent: I have reviewed the patients History and Physical, chart, labs and discussed the procedure including the risks, benefits and alternatives for the proposed anesthesia with the patient or authorized representative who has indicated his/her understanding and acceptance.  ? ? ? ?Dental advisory given ? ?Plan Discussed with: CRNA ? ?Anesthesia Plan Comments: (Plan for  preoperative interscalene nerve block and GETA.  Patient consented for risks of anesthesia including but not limited to:  ?- adverse reactions to medications ?- damage to eyes, teeth, lips or other oral mucosa ?- nerve damage due to positioning  ?- sore throat or hoarseness ?- damage to heart, brain, nerves, lungs, other parts of body or loss of life ? ?Informed patient about role of CRNA in peri- and intra-operative care.  Patient voiced understanding.)  ? ? ? ? ? ? ?Anesthesia Quick Evaluation ? ?

## 2021-10-03 NOTE — Anesthesia Procedure Notes (Signed)
Anesthesia Regional Block: Interscalene brachial plexus block  ? ?Pre-Anesthetic Checklist: , timeout performed,  Correct Patient, Correct Site, Correct Laterality,  Correct Procedure,, site marked,  Risks and benefits discussed,  Surgical consent,  Pre-op evaluation,  At surgeon's request and post-op pain management ? ?Laterality: Right ? ?Prep: chloraprep     ?  ?Needles:  ?Injection technique: Single-shot ? ?Needle Type: Echogenic Needle   ? ? ? ? ? ? ? ?Additional Needles: ? ? ?Procedures:,,,, ultrasound used (permanent image in chart),,   ?Motor weakness within 20 minutes.  ?Narrative:  ?Start time: 10/03/2021 12:50 PM ?End time: 10/03/2021 12:52 PM ?Injection made incrementally with aspirations every 5 mL. ? ?Performed by: Personally  ?Anesthesiologist: Darrin Nipper, MD ? ?Additional Notes: ?Functioning IV was confirmed and monitors applied.  Sterile prep and drape, hand hygiene and sterile gloves were used. Ultrasound guidance: relevant anatomy identified, needle position confirmed, local anesthetic spread visualized around nerve(s), vascular puncture avoided.  Image saved to electronic medical record.  Negative aspiration prior to incremental administration of local anesthetic for total 20 ml Exparel and 10 ml bupivacaine 0.5% given in interscalene distribution. The patient tolerated the procedure well. Vital signs and moderate sedation medications recorded in RN notes.  ? ? ? ?

## 2021-10-03 NOTE — Transfer of Care (Signed)
Immediate Anesthesia Transfer of Care Note ? ?Patient: Keith Holland ? ?Procedure(s) Performed: RIGHT SHOULDER ARTHROSCOPY WITH DEBRIDEMENT, DECOMPRESSION, ROTATOR CUFF REPAIR, AND BICEPS TENODESIS (Right: Shoulder) ? ?Patient Location: PACU ? ?Anesthesia Type:General ? ?Level of Consciousness: awake, alert  and oriented ? ?Airway & Oxygen Therapy: Patient Spontanous Breathing ? ?Post-op Assessment: Report given to RN and Post -op Vital signs reviewed and stable ? ?Post vital signs: Reviewed and stable ? ?Last Vitals:  ?Vitals Value Taken Time  ?BP 138/67 10/03/21 1615  ?Temp    ?Pulse 57 10/03/21 1618  ?Resp 17 10/03/21 1618  ?SpO2 99 % 10/03/21 1618  ?Vitals shown include unvalidated device data. ? ?Last Pain:  ?Vitals:  ? 10/03/21 1207  ?TempSrc: Oral  ?   ? ?  ? ?Complications: No notable events documented. ?

## 2021-10-03 NOTE — Discharge Instructions (Addendum)
Orthopedic discharge instructions: ?Keep dressing dry and intact.  ?May shower after dressing changed on post-op day #4 (Monday).  ?Cover staples/sutures with Band-Aids after drying off. ?Apply ice frequently to shoulder. ?Take ibuprofen 600-800 mg TID with meals for 7-10 days, then as necessary. ?Take oxycodone as prescribed when needed.  ?May supplement with ES Tylenol if necessary. ?Keep shoulder immobilizer on at all times except may remove for bathing purposes. ?Follow-up in 10-14 days or as scheduled. ? ?AMBULATORY SURGERY  ?DISCHARGE INSTRUCTIONS ? ? ?The drugs that you were given will stay in your system until tomorrow so for the next 24 hours you should not: ? ?Drive an automobile ?Make any legal decisions ?Drink any alcoholic beverage ? ? ?You may resume regular meals tomorrow.  Today it is better to start with liquids and gradually work up to solid foods. ? ?You may eat anything you prefer, but it is better to start with liquids, then soup and crackers, and gradually work up to solid foods. ? ? ?Please notify your doctor immediately if you have any unusual bleeding, trouble breathing, redness and pain at the surgery site, drainage, fever, or pain not relieved by medication. ? ? ? ?Additional Instructions: ? ? ? ? ? ? ? ?Please contact your physician with any problems or Same Day Surgery at [phone], Monday through Friday 6 am to 4 pm, or [place] at [facility] number at [phone].    Interscalene Nerve Block with Exparel ? ? For your surgery you have received an Interscalene Nerve Block with Exparel. ?Nerve Blocks affect many types of nerves, including nerves that control movement, pain and normal sensation.  You may experience feelings such as numbness, tingling, heaviness, weakness or the inability to move your arm or the feeling or sensation that your arm has "fallen asleep". ?A nerve block with Exparel can last up to 5 days.  Usually the weakness wears off first.  The tingling and  heaviness usually wear off next.  Finally you may start to notice pain.  Keep in mind that this may occur in any order.  Once a nerve block starts to wear off it is usually completely gone within 60 minutes. ?ISNB may cause mild shortness of breath, a hoarse voice, blurry vision, unequal pupils, or drooping of the face on the same side as the nerve block.  These symptoms will usually resolve with the numbness.  Very rarely the procedure itself can cause mild seizures. ?If needed, your surgeon will give you a prescription for pain medication.  It will take about 60 minutes for the oral pain medication to become fully effective.  So, it is recommended that you start taking this medication before the nerve block first begins to wear off, or when you first begin to feel discomfort. ?Take your pain medication only as prescribed.  Pain medication can cause sedation and decrease your breathing if you take more than you need for the level of pain that you have. ?Nausea is a common side effect of many pain medications.  You may want to eat something before taking your pain medicine to prevent nausea. ?After an Interscalene nerve block, you cannot feel pain, pressure or extremes in temperature in the effected arm.  Because your arm is numb it is at an increased risk for injury.  To decrease the possibility of injury, please practice the following: ? ?While you are awake change the position of your arm frequently to prevent too much pressure on any one area for prolonged periods  of time. ? If you have a cast or tight dressing, check the color or your fingers every couple of hours.  Call your surgeon with the appearance of any discoloration (white or blue). ?If you are given a sling to wear before you go home, please wear it  at all times until the block has completely worn off.  Do not get up at night without your sling. ?Please contact [facility] or your surgeon if you do not begin to regain sensation after 7 days from  the surgery.  Anesthesia may be contacted by calling the Same Day Surgery Department, Mon. through Fri., 6 am to 4 pm at [phone].   ?If you experience any other problems or concerns, please contact your surgeon's office. ?If you experience severe or prolonged shortness of breath go to the nearest emergency department.  ?

## 2021-10-03 NOTE — H&P (Signed)
History of Present Illness: ?Keith Holland is a 66 y.o. male who presents today for his surgical history and physical for upcoming surgery. The patient is scheduled for a right shoulder arthroscopy with debridement, decompression, rotator cuff repair and biceps tenodesis. Surgery is scheduled with Dr. Roland Rack on 10/03/2021. The patient denies any trauma or injury affecting the right shoulder since his last evaluation. The patient continues to have increased pain with trying to reach above his head or out to the side. The patient denies any numbness or ting of the right upper extremity at today's visit. The patient does have a history of diabetes, he does use insulin. He denies any history of heart attack, stroke, COPD or asthma. He denies any history of blood clots. The patient does have a history of a subarachnoid hemorrhage 2000 years ago, he is not on any blood thinning medication at this time. ? ?Past Medical History: ? Abscess of great toe of right foot 08/05/2004  ? 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 Allergic rhinitis  ? COVID-19 virus infection 03/27/2020  ? Esophageal reflux  ? History of adenomatous polyp of colon 09/17/2016 (On 05/20/2010 colonoscopy-- to repeat in 5 years)  ? Hyperlipemia  ? Osteoarthritis of knees, bilateral  ? Prostate cancer (CMS-HCC) 2018 (S/P radical robotic prostatectomy 12/29/2016 by Dr. Erlene Quan)  ? Subarachnoid hemorrhage (CMS-HCC) in 2000-- no source identified  ? 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 ?Past Surgical History: ? ARTHROSCOPY KNEE Left 01/10/2004 (meniscetomy and patellar debridemant)  ? COLONOSCOPY 05/20/2010 (Adenomatous Polyp: [facility] 05/2015)  ? ARTHROSCOPY KNEE Right 11/2013 (with meniscal repair)  ? COLONOSCOPY 08/2016  ? LAPAROSCOPIC PROSTATECTOMY ROBOTIC N/A 12/29/2016 (Dr. Erlene Quan)  ? COLONOSCOPY 10/01/2017 (Adenomatous Polyps: [facility] 09/2022)  ? COLONOSCOPY 08/16/2019 (Negative colon biopsy/PHx CP/Repeat  29yr/TKT)  ? I & D of abscess right great toe  ? ?Past Family History: ? Thyroid disease Mother  ? Alzheimer's disease Mother  ? Diabetes Father  ? Prostate cancer Father  ? Diabetes Brother  ? Diabetes Paternal Grandfather  ? Colon cancer Neg Hx  ? Breast cancer Neg Hx  ? Coronary Artery Disease (Blocked arteries around heart) Neg Hx  ? ?Medications: ? blood glucose diagnostic test strip Use 2 (two) times daily as instructed. 200 each 3  ? glipiZIDE (GLUCOTROL XL) 5 MG XL tablet Take 1 tablet (5 mg total) by mouth every morning with breakfast for diabetes 90 tablet 3  ? insulin DEGLUDEC (TRESIBA FLEXTOUCH U-200) pen injector (concentration 200 units/mL) Inject 26 Units subcutaneously at bedtime or as otherwise directed 200 mL 1  ? lancets (ACCU-CHEK MULTICLIX LANCET) Use as instructed two times daily 100 each 12  ? metFORMIN (GLUCOPHAGE-XR) 500 MG XR tablet Take 2 tablets (1,000 mg total) by mouth daily with dinner for diabetes 180 tablet 3  ? omeprazole (PRILOSEC) 20 MG DR capsule Take 1 capsule (20 mg total) by mouth once daily for reflux 90 capsule 3  ? pen needle, diabetic (BD ULTRA-FINE MINI PEN NEEDLE) 31 gauge x 3/16" needle USE ONCE DAILY 100 each 3  ? pioglitazone (ACTOS) 30 MG tablet Take 1 tablet (30 mg total) by mouth once daily for diabetes 90 tablet 3  ? simvastatin (ZOCOR) 40 MG tablet Take 1 tablet (40 mg total) by mouth once daily for cholesterol 90 tablet 3  ? ?Allergies: ? Hydrocodone-Acetaminophen Nausea And Vomiting (Can tolerate with Phenergan)  ? Oxycodone-Acetaminophen Nausea And Vomiting  ? ?Review of Systems:  ?A comprehensive 14  point ROS was performed, reviewed by me today, and the pertinent orthopaedic findings are documented in the HPI. ? ?Physical Exam: ?BP 130/82 (BP Location: Left upper arm, Patient Position: Sitting, BP Cuff Size: Adult)  Ht 167.6 cm ('5\' 6"'$ )  Wt 90.7 kg (200 lb)  BMI 32.28 kg/m?  ?General/Constitutional: The patient appears to be well-nourished, well-developed,  and in no acute distress. ?Neuro/Psych: Normal mood and affect, oriented to person, place and time. ?Eyes: Non-icteric. Pupils are equal, round, and reactive to light, and exhibit synchronous movement. ?ENT: Unremarkable. ?Lymphatic: No palpable adenopathy. ?Respiratory: Lungs clear to auscultation, Normal chest excursion, No wheezes and Non-labored breathing ?Cardiovascular: Regular rate and rhythm. No murmurs. and No edema, swelling or tenderness, except as noted in detailed exam. ?Integumentary: No impressive skin lesions present, except as noted in detailed exam. ?Musculoskeletal: Unremarkable, except as noted in detailed exam. ? ?Right shoulder exam: ?SKIN: normal ?SWELLING: none ?WARMTH: none ?LYMPH NODES: no adenopathy palpable ?CREPITUS: none ?TENDERNESS: Mildly tender over the anterolateral shoulder, as well as along the bicipital groove anteriorly. ?ROM (active):  ?Forward flexion: 150 degrees ?Abduction: 100 ?Internal rotation: Right PSIS ?ROM (passive):  ?Forward flexion: 155 degrees ?Abduction: 140 degrees  ?ER/IR at 90 abd: 75 degrees / 65 degrees ?  ?He has moderate pain with all motions. ?  ?STRENGTH: Forward flexion: 4+/5 ?Abduction: 4+/5 ?External rotation: 4-4+/5 ?Internal rotation: 4+/5 ?Pain with RC testing: Mild pain with resisted forward flexion more so than with resisted internal or external rotation. ?  ?STABILITY: Normal ?  ?SPECIAL TESTS: Luan Pulling' test: positive, moderate ?Speed's test: positive ?Capsulitis - pain w/ passive ER: no ?Crossed arm test: Mildly positive ?Crank: Not evaluated ?Anterior apprehension: Negative ?Posterior apprehension: Not evaluated ?  ?He is neurovascularly intact to the right upper extremity. ?  ?Right Shoulder Imaging, MRI: ?Right Shoulder: ?MRI Shoulder Cartilage: Partial thickness humeral head cartilage loss. ?MRI Shoulder Rotator Cuff: Full thickness tear of the supraspinatus. Retracted to the humeral head. ?MRI Shoulder Labrum / Biceps: Biceps tendinopathy.  Superior labral tear. ?MRI Shoulder Bone: Normal bone. ?  ?The report were discussed with the patient and family at today's visit. ? ?Impression: ?1. Nontraumatic complete tear of right rotator cuff. ?2. Injury of tendon of long head of right biceps. ? ?Plan:  ?1. Treatment options were discussed today with the patient. ?2. The patient is scheduled for a right shoulder arthroscopy with debridement, decompression, rotator cuff repair and biceps tenodesis. Surgery scheduled with Dr. Roland Rack on 10/03/2021. ?3. The patient was given detailed instructions on both the risk and benefits of surgery and wishes to proceed at this time. ?4. The patient was offered and received a slingshot shoulder sling to wear following surgery. ?5. This document will serve as a surgical history and physical for the patient. ?6. The patient will follow-up per standard postop protocol. They can call the clinic they have any questions, new symptoms develop or symptoms worsen. ? ?The procedure was discussed with the patient, as were the potential risks (including bleeding, infection, nerve and/or blood vessel injury, persistent or recurrent pain, failure of the repair, frozen shoulder, progression of arthritis, need for further surgery, blood clots, strokes, heart attacks and/or arhythmias, pneumonia, etc.) and benefits. The patient states his understanding and wishes to proceed. ? ? ?H&P reviewed and patient re-examined. No changes. ? ?

## 2021-10-03 NOTE — Op Note (Signed)
10/03/2021 ? ?4:14 PM ? ?Patient:   Keith Holland ? ?Pre-Op Diagnosis:   Impingement/tendinopathy with nontraumatic full-thickness rotator cuff tear with biceps tendinopathy, right shoulder ? ?Post-Op Diagnosis:   Impingement/tendinopathy with nontraumatic full-thickness rotator cuff tear, labral fraying, adhesive capsulitis, and biceps tendinopathy, right shoulder. ? ?Procedure:   Extensive arthroscopic debridement, arthroscopic subacromial decompression, mini-open rotator cuff repair, manipulation under anesthesia, and mini-open biceps tenodesis, right shoulder. ? ?Anesthesia:   General endotracheal with interscalene block using Exparel placed preoperatively by the anesthesiologist. ? ?Surgeon:   Pascal Lux, MD ? ?Assistant:   Waylan Boga, RNFA ? ?Findings:   As above. There was a full-thickness delaminating tear involving the entire supraspinatus insertion with 1.5 cm of retraction. The remainder of the rotator cuff was in satisfactory condition. There was evidence of fraying of the biceps tendon. The labrum demonstrated evidence of degenerative fraying anteriorly and superiorly without frank detachment from the glenoid rim. The articular surfaces of the glenoid and humerus both were in satisfactory condition. ? ?Complications:   None ? ?Fluids:   800 cc ? ?Estimated blood loss:   25 cc ? ?Tourniquet time:   None ? ?Drains:   None ? ?Closure:   Staples     ? ?Brief clinical note:   The patient is a 66 year old male with a several year history of gradually worsening pain and weakness of his right shoulder. The patient's symptoms have progressed despite medications, activity modification, etc. The patient's history and examination are consistent with impingement/tendinopathy with a rotator cuff tear. These findings were confirmed by MRI scan. The patient presents at this time for definitive management of these shoulder symptoms. ? ?Procedure:   The patient underwent placement of an interscalene block using  Exparel by the anesthesiologist in the preoperative holding area before being brought into the operating room and lain in the supine position. The patient then underwent general endotracheal intubation and anesthesia before being repositioned in the beach chair position using the beach chair positioner. The right shoulder and upper extremity were prepped with ChloraPrep solution before being draped sterilely. Preoperative antibiotics were administered. A timeout was performed to confirm the proper surgical site before the shoulder was placed through range of motion. Prior to manipulation, the shoulder could be externally rotated to 70 degrees at 90 degrees of abduction. Several palpable and audible pops were heard as the shoulder adhesions released. Following manipulation, the shoulder could be externally rotated to 90 degrees at 90 degrees of abduction. The expected portal sites and incision site were injected with 0.5% Sensorcaine with epinephrine.  ? ?A posterior portal was created and the glenohumeral joint thoroughly inspected with the findings as described above. An anterior portal was created using an outside-in technique. The labrum and rotator cuff were further probed, again confirming the above-noted findings.  Areas of labral fraying were debrided back to stable margins using the full-radius resector. The ArthroCare wand was inserted and used to release the biceps tendon from its labral anchor. It also was used to debride the rotator interval anteriorly to reduce the likelihood of recurrent adhesive capsulitis. Finally, the ArthroCare wand was used to obtain hemostasis as well as to "anneal" the labrum superiorly and anteriorly. The instruments were removed from the joint after suctioning the excess fluid. ? ?The camera was repositioned through the posterior portal into the subacromial space. A separate lateral portal was created using an outside-in technique. The 3.5 mm full-radius resector was  introduced and used to perform a subtotal bursectomy. The [employer]  wand was then inserted and used to remove the periosteal tissue off the undersurface of the anterior third of the acromion as well as to recess the coracoacromial ligament from its attachment along the anterior and lateral margins of the acromion. The 4.0 mm acromionizing bur was introduced and used to complete the decompression by removing the undersurface of the anterior third of the acromion. The full radius resector was reintroduced to remove any residual bony debris before the ArthroCare wand was reintroduced to obtain hemostasis. The instruments were then removed from the subacromial space after suctioning the excess fluid. ? ?An approximately 4-5 cm incision was made over the anterolateral aspect of the shoulder beginning at the anterolateral corner of the acromion and extending distally in line with the bicipital groove. This incision was carried down through the subcutaneous tissues to expose the deltoid fascia. The raphae between the anterior and middle thirds was identified and this plane developed to provide access into the subacromial space. Additional bursal tissues were debrided sharply using Metzenbaum scissors. The rotator cuff tear was readily identified. The margins were debrided sharply with a #15 blade and the exposed greater tuberosity roughened with a rongeur. The tear was repaired using two [employer] 2.8 mm Q-Fix Biomet anchors. These sutures were then brought back laterally and secured using two [place] knotless RegeneSorb anchors to create a two-layer closure. An apparent watertight closure was obtained. ? ?The bicipital groove was identified by palpation and opened for 1-1.5 cm. The biceps tendon stump was retrieved through this defect. The floor of the bicipital groove was roughened with a curet before a Biomet 2.9 mm JuggerKnot anchor was inserted. Both sets of sutures were passed through the  biceps tendon and tied securely to effect the tenodesis. The bicipital sheath was reapproximated using two #0 Ethibond interrupted sutures, incorporating the biceps tendon to further reinforce the tenodesis. ? ?The wound was copiously irrigated with sterile saline solution before the deltoid raphae was reapproximated using 2-0 Vicryl interrupted sutures. The subcutaneous tissues were closed in two layers using 2-0 Vicryl interrupted sutures before the skin was closed using staples. The portal sites also were closed using staples. A sterile bulky dressing was applied to the shoulder before the arm was placed into a shoulder immobilizer. The patient was then awakened, extubated, and returned to the recovery room in satisfactory condition after tolerating the procedure well. ?

## 2021-10-04 ENCOUNTER — Encounter: Payer: Self-pay | Admitting: Surgery

## 2021-10-04 NOTE — Anesthesia Postprocedure Evaluation (Signed)
Anesthesia Post Note ? ?Patient: Keith Holland ? ?Procedure(s) Performed: RIGHT SHOULDER ARTHROSCOPY WITH DEBRIDEMENT, DECOMPRESSION, ROTATOR CUFF REPAIR, AND BICEPS TENODESIS (Right: Shoulder) ? ?Patient location during evaluation: PACU ?Anesthesia Type: Regional ?Level of consciousness: awake and alert ?Pain management: pain level controlled ?Vital Signs Assessment: post-procedure vital signs reviewed and stable ?Respiratory status: spontaneous breathing, nonlabored ventilation and respiratory function stable ?Cardiovascular status: blood pressure returned to baseline and stable ?Postop Assessment: no apparent nausea or vomiting ?Anesthetic complications: no ? ? ?No notable events documented. ? ? ?Last Vitals:  ?Vitals:  ? 10/03/21 1645 10/03/21 1658  ?BP: 128/73 (!) 144/77  ?Pulse: (!) 53 (!) 55  ?Resp: 15 14  ?Temp: (!) 36.2 ?C (!) 36.1 ?C  ?SpO2: 97% 97%  ?  ?Last Pain:  ?Vitals:  ? 10/03/21 1645  ?TempSrc:   ?PainSc: 0-No pain  ? ? ?  ?  ?  ?  ?  ?  ? ?[place] Ouch ? ? ? ? ?

## 2022-01-03 ENCOUNTER — Other Ambulatory Visit: Payer: Self-pay | Admitting: Surgery

## 2022-01-06 ENCOUNTER — Encounter
Admission: RE | Admit: 2022-01-06 | Discharge: 2022-01-06 | Disposition: A | Discharge status: Home / Self Care | Payer: Medicare PPO | Source: Ambulatory Visit | Attending: Surgery | Admitting: Surgery

## 2022-01-06 VITALS — BP 139/77 | HR 60 | Resp 12 | Ht 66.0 in | Wt 189.0 lb

## 2022-01-06 DIAGNOSIS — Z01812 Encounter for preprocedural laboratory examination: Secondary | ICD-10-CM

## 2022-01-07 ENCOUNTER — Other Ambulatory Visit: Payer: Self-pay

## 2022-01-07 ENCOUNTER — Encounter
Admission: RE | Disposition: A | Discharge status: Home / Self Care | Payer: Self-pay | Source: Home / Self Care | Attending: Surgery

## 2022-01-07 ENCOUNTER — Encounter: Payer: Self-pay | Admitting: Surgery

## 2022-01-07 ENCOUNTER — Ambulatory Visit: Payer: Medicare PPO

## 2022-01-07 ENCOUNTER — Ambulatory Visit: Payer: Medicare PPO | Admitting: Certified Registered Nurse Anesthetist

## 2022-01-07 ENCOUNTER — Ambulatory Visit
Admission: RE | Admit: 2022-01-07 | Discharge: 2022-01-07 | Disposition: A | Discharge status: Home / Self Care | Payer: Medicare PPO | Attending: Surgery | Admitting: Surgery

## 2022-01-07 DIAGNOSIS — M7501 Adhesive capsulitis of right shoulder: Secondary | ICD-10-CM | POA: Diagnosis not present

## 2022-01-07 DIAGNOSIS — Z01812 Encounter for preprocedural laboratory examination: Secondary | ICD-10-CM

## 2022-01-07 DIAGNOSIS — Z9889 Other specified postprocedural states: Secondary | ICD-10-CM | POA: Diagnosis not present

## 2022-01-07 DIAGNOSIS — Z87891 Personal history of nicotine dependence: Secondary | ICD-10-CM | POA: Diagnosis not present

## 2022-01-07 DIAGNOSIS — E119 Type 2 diabetes mellitus without complications: Secondary | ICD-10-CM | POA: Diagnosis not present

## 2022-01-07 DIAGNOSIS — K219 Gastro-esophageal reflux disease without esophagitis: Secondary | ICD-10-CM | POA: Insufficient documentation

## 2022-01-07 HISTORY — PX: CLOSED MANIPULATION SHOULDER WITH STERIOD INJECTION: SHX5611

## 2022-01-07 LAB — GLUCOSE, CAPILLARY
Glucose-Capillary: 154 mg/dL — ABNORMAL HIGH (ref 70–99)
Glucose-Capillary: 148 mg/dL — ABNORMAL HIGH (ref 70–99)

## 2022-01-07 SURGERY — CLOSED MANIPULATION SHOULDER WITH STEROID INJECTION
Anesthesia: General | Site: Shoulder | Laterality: Right

## 2022-01-07 MED ORDER — FENTANYL CITRATE (PF) 100 MCG/2ML IJ SOLN
INTRAMUSCULAR | Status: AC
  Filled 2022-01-07: qty 2

## 2022-01-07 MED ORDER — BUPIVACAINE HCL (PF) 0.5 % IJ SOLN
INTRAMUSCULAR | Status: DC | PRN
  Administered 2022-01-07 (×2): 10 mL via PERINEURAL

## 2022-01-07 MED ORDER — METOCLOPRAMIDE HCL 10 MG PO TABS: 5.0000 mg | ORAL_TABLET | Freq: Three times a day (TID) | ORAL | Status: DC | PRN

## 2022-01-07 MED ORDER — ORAL CARE MOUTH RINSE: 15.0000 mL | Freq: Once | OROMUCOSAL | Status: AC

## 2022-01-07 MED ORDER — MIDAZOLAM HCL 2 MG/2ML IJ SOLN
INTRAMUSCULAR | Status: AC
  Administered 2022-01-07: 1 mg via INTRAVENOUS
  Filled 2022-01-07: qty 2

## 2022-01-07 MED ORDER — ROPIVACAINE HCL 5 MG/ML IJ SOLN
INTRAMUSCULAR | Status: AC
  Filled 2022-01-07: qty 20

## 2022-01-07 MED ORDER — TRAMADOL HCL 50 MG PO TABS: 50.0000 mg | ORAL_TABLET | Freq: Four times a day (QID) | ORAL | Status: DC | PRN

## 2022-01-07 MED ORDER — FENTANYL CITRATE PF 50 MCG/ML IJ SOSY
PREFILLED_SYRINGE | INTRAMUSCULAR | Status: AC
  Administered 2022-01-07: 50 ug via INTRAVENOUS
  Filled 2022-01-07: qty 1

## 2022-01-07 MED ORDER — KETOROLAC TROMETHAMINE 15 MG/ML IJ SOLN
15.0000 mg | Freq: Once | INTRAMUSCULAR | Status: AC
  Administered 2022-01-07: 15 mg via INTRAVENOUS

## 2022-01-07 MED ORDER — BUPIVACAINE HCL (PF) 0.5 % IJ SOLN
INTRAMUSCULAR | Status: AC
  Filled 2022-01-07: qty 20

## 2022-01-07 MED ORDER — CHLORHEXIDINE GLUCONATE 0.12 % MT SOLN
OROMUCOSAL | Status: AC
  Administered 2022-01-07: 15 mL via OROMUCOSAL
  Filled 2022-01-07: qty 15

## 2022-01-07 MED ORDER — KETOROLAC TROMETHAMINE 15 MG/ML IJ SOLN: 7.5000 mg | Freq: Four times a day (QID) | INTRAMUSCULAR | Status: DC

## 2022-01-07 MED ORDER — KETOROLAC TROMETHAMINE 15 MG/ML IJ SOLN
INTRAMUSCULAR | Status: AC
  Filled 2022-01-07: qty 1

## 2022-01-07 MED ORDER — CHLORHEXIDINE GLUCONATE 0.12 % MT SOLN: 15.0000 mL | Freq: Once | OROMUCOSAL | Status: AC

## 2022-01-07 MED ORDER — SODIUM CHLORIDE 0.9 % IV SOLN: INTRAVENOUS | Status: DC

## 2022-01-07 MED ORDER — FENTANYL CITRATE PF 50 MCG/ML IJ SOSY: 50.0000 ug | PREFILLED_SYRINGE | Freq: Once | INTRAMUSCULAR | Status: AC

## 2022-01-07 MED ORDER — BUPIVACAINE-EPINEPHRINE (PF) 0.25% -1:200000 IJ SOLN
INTRAMUSCULAR | Status: AC
  Filled 2022-01-07: qty 30

## 2022-01-07 MED ORDER — METOCLOPRAMIDE HCL 5 MG/ML IJ SOLN: 5.0000 mg | Freq: Three times a day (TID) | INTRAMUSCULAR | Status: DC | PRN

## 2022-01-07 MED ORDER — PROPOFOL 10 MG/ML IV BOLUS
INTRAVENOUS | Status: DC | PRN
  Administered 2022-01-07: 40 mg via INTRAVENOUS
  Administered 2022-01-07: 20 mg via INTRAVENOUS
  Administered 2022-01-07: 40 mg via INTRAVENOUS

## 2022-01-07 MED ORDER — ONDANSETRON HCL 4 MG/2ML IJ SOLN: 4.0000 mg | Freq: Four times a day (QID) | INTRAMUSCULAR | Status: DC | PRN

## 2022-01-07 MED ORDER — TRIAMCINOLONE ACETONIDE 40 MG/ML IJ SUSP
INTRAMUSCULAR | Status: DC | PRN
  Administered 2022-01-07: 10 mL via INTRA_ARTICULAR

## 2022-01-07 MED ORDER — MIDAZOLAM HCL 2 MG/2ML IJ SOLN: 1.0000 mg | INTRAMUSCULAR | Status: DC | PRN

## 2022-01-07 MED ORDER — ONDANSETRON HCL 4 MG PO TABS: 4.0000 mg | ORAL_TABLET | Freq: Four times a day (QID) | ORAL | Status: DC | PRN

## 2022-01-07 MED ORDER — TRIAMCINOLONE ACETONIDE 40 MG/ML IJ SUSP
INTRAMUSCULAR | Status: AC
  Filled 2022-01-07: qty 1

## 2022-01-07 SURGICAL SUPPLY — 7 items
BNDG ADH 1X3 SHEER STRL LF (GAUZE/BANDAGES/DRESSINGS) ×2 IMPLANT
BNDG ADH THN 3X1 STRL LF (GAUZE/BANDAGES/DRESSINGS) ×1
NDL HYPO 21X1.5 SAFETY (NEEDLE) ×1 IMPLANT
NEEDLE HYPO 21X1.5 SAFETY (NEEDLE) ×2 IMPLANT
PAD ALCOHOL SWAB (MISCELLANEOUS) ×4 IMPLANT
SLING ARM M TX990204 (SOFTGOODS) ×2 IMPLANT
SYR 10ML LL (SYRINGE) ×2 IMPLANT

## 2022-01-08 ENCOUNTER — Encounter: Payer: Self-pay | Admitting: Surgery

## 2022-01-16 ENCOUNTER — Other Ambulatory Visit: Payer: Medicare PPO

## 2022-01-16 ENCOUNTER — Other Ambulatory Visit: Payer: Self-pay | Admitting: *Deleted

## 2022-01-16 DIAGNOSIS — C61 Malignant neoplasm of prostate: Secondary | ICD-10-CM

## 2022-01-17 LAB — PSA: Prostate Specific Ag, Serum: <0.1 ng/mL (ref 0.0–4.0)

## 2022-01-21 ENCOUNTER — Ambulatory Visit: Payer: Medicare PPO | Admitting: Urology

## 2022-01-21 ENCOUNTER — Encounter: Payer: Self-pay | Admitting: Urology

## 2022-01-21 VITALS — BP 142/80 | HR 66 | Ht 66.0 in | Wt 188.0 lb

## 2022-01-21 DIAGNOSIS — C61 Malignant neoplasm of prostate: Secondary | ICD-10-CM

## 2022-01-21 DIAGNOSIS — Z8546 Personal history of malignant neoplasm of prostate: Secondary | ICD-10-CM

## 2022-01-21 NOTE — Progress Notes (Signed)
01/21/22 11:09 AM   Keith Holland 12/28/1955 [id number]  Referring provider:  Ezequiel Kayser, MD [address] [place],  [place] [address] Chief Complaint  Patient presents with   Prostate Cancer      HPI: Keith Holland is a 66 y.o.male  s/p radical robotic prostatectomy 12/2016 who returns today for routine follow up.   He was initially diagnosed with Gleason 3+3 prostate cancer, PSA 9.5 disease. He elected to proceed with radical robotic prostatectomy which was performed on 12/29/2016.  Lymphadenectomy was not performed based on risk stratification.    Surgical pathology was consistent with Gleason 3+4, pT3aNxMx with focal microscopic invasion of the bladder neck with a unifocal 1 mm positive margin at this level. + extraprostatic extension at the bladder neck,+ bladder neck margin (right), - LVI, +PNI.   He underwent adjuvant radiation in 73/2202 without complication. This was well-tolerated.  His most recent PSA remains undetectable as of 01/16/2022.   He reports that he is using a Wiesner clamp and it has been successful he does not wear it at night.   He does have erectile dysfunction but declined discussing this today.  PMH: Past Medical History:  Diagnosis Date   Anemia    Arthritis    Cancer ([place]) 2018   Prostate- December 30, 5425 removed   Complication of anesthesia    severe n/v   Diabetes mellitus without complication ([place])    Family history of adverse reaction to anesthesia    pts dads heart stopped while in PACU   GERD (gastroesophageal reflux disease)    Hyperlipidemia    PONV (postoperative nausea and vomiting)    SAH (subarachnoid hemorrhage) ([place])    spontaneous 2000 n surgery   Vertigo    since childhood if on back long will get vertigo     Surgical History: Past Surgical History:  Procedure Laterality Date   CLOSED MANIPULATION SHOULDER WITH STERIOD INJECTION Right 01/07/2022   Procedure: CLOSED MANIPULATION SHOULDER WITH STEROID  INJECTION;  Surgeon: Corky Mull, MD;  Location: [facility] OR[facility];  Service: Orthopedics;  Laterality: Right;   COLONOSCOPY     2011, 2018, 2019, 2021   KNEE ARTHROSCOPY Left 01/10/2004   meniscus and patellar debridement   KNEE ARTHROSCOPY Right 11/2013   meniscus repair   MENISCUS REPAIR Left    ROBOT ASSISTED LAPAROSCOPIC RADICAL PROSTATECTOMY N/A 12/29/2016   Procedure: ROBOTIC ASSISTED LAPAROSCOPIC RADICAL PROSTATECTOMY;  Surgeon: Hollice Espy, MD;  Location: [facility];  Service: Urology;  Laterality: N/A;   SHOULDER ARTHROSCOPY WITH SUBACROMIAL DECOMPRESSION, ROTATOR CUFF REPAIR AND BICEP TENDON REPAIR Right 10/03/2021   Procedure: RIGHT SHOULDER ARTHROSCOPY WITH DEBRIDEMENT, DECOMPRESSION, ROTATOR CUFF REPAIR, AND BICEPS TENODESIS;  Surgeon: Corky Mull, MD;  Location: [facility];  Service: Orthopedics;  Laterality: Right;   TOTAL KNEE ARTHROPLASTY Left 02/02/2018   Procedure: TOTAL KNEE ARTHROPLASTY;  Surgeon: Hessie Knows, MD;  Location: [facility];  Service: Orthopedics;  Laterality: Left;    Home Medications:  Allergies as of 01/21/2022       Reactions   Percocet [oxycodone-acetaminophen] Nausea And Vomiting   Vicodin [hydrocodone-acetaminophen] Nausea And Vomiting, Other (See Comments)   Can tolerate with Phenergan        Medication List        Accurate as of January 21, 2022 11:09 AM. If you have any questions, ask your nurse or doctor.          glipiZIDE 5 MG 24 hr tablet Commonly known as: GLUCOTROL  XL Take 5 mg by mouth at bedtime.   ibuprofen 200 MG tablet Commonly known as: ADVIL Take 600 mg by mouth every 6 (six) hours as needed for moderate pain.   insulin degludec 200 UNIT/ML FlexTouch Pen Commonly known as: TRESIBA Inject 30 Units into the skin at bedtime.   metFORMIN 500 MG 24 hr tablet Commonly known as: GLUCOPHAGE-XR Take 1,000 mg by mouth at bedtime.   omeprazole 20 MG capsule Commonly known as: PRILOSEC Take 20 mg by mouth at bedtime.    pioglitazone 30 MG tablet Commonly known as: ACTOS Take 30 mg by mouth every evening.   simvastatin 40 MG tablet Commonly known as: ZOCOR Take 40 mg by mouth every evening.   traMADol 50 MG tablet Commonly known as: Ultram Take 1 tablet (50 mg total) by mouth every 4 (four) hours as needed for moderate pain.        Allergies:  Allergies  Allergen Reactions   Percocet [Oxycodone-Acetaminophen] Nausea And Vomiting   Vicodin [Hydrocodone-Acetaminophen] Nausea And Vomiting and Other (See Comments)    Can tolerate with Phenergan    Family History: Family History  Problem Relation Age of Onset   Prostate cancer Father    Stroke Father    Stroke Mother    Bladder Cancer Neg Hx    Kidney cancer Neg Hx     Social History:  reports that he has quit smoking. His smoking use included cigarettes and cigars. He quit smokeless tobacco use about 32 years ago.  His smokeless tobacco use included chew. He reports current alcohol use. He reports that he does not use drugs.   Physical Exam: BP (!) 142/80   Pulse 66   Ht '5\' 6"'$  (1.676 m)   Wt 188 lb (85.3 kg)   BMI 30.34 kg/m   Constitutional:  Alert and oriented, No acute distress. HEENT: [place] AT, moist mucus membranes.  Trachea midline, no masses. Cardiovascular: No clubbing, cyanosis, or edema. Respiratory: Normal respiratory effort, no increased work of breathing. GU: Circumcised phallus wearing Wiesner clamp.  No skin breakdown noted. Skin: No rashes, bruises or suspicious lesions. Neurologic: Grossly intact, no focal deficits, moving all 4 extremities. Psychiatric: Normal mood and affect.  Laboratory Data: Lab Results  Component Value Date   CREATININE 0.80 10/01/2021    Assessment & Plan:   1. Prostate cancer ([place]) - NED - Continue Q 74-monthPSAs, lab only in 6 months -After this next PSA check, can transition to annual PSA surveillance given 5 years without recurrence/no evidence of disease -He would like to continue  this with his PCP annually which is reasonable, advised to return if his PSA is ever anything other than undetectable   2. SUI (stress urinary incontinence), male - Improved, currently uses penile clamp, no notable skin irritation or breakdown appreciated thus can continue current regimen - Not interested in any further intervention     IConley Rollsas a scribe for AHollice Espy MD.,have documented all relevant documentation on the behalf of AHollice Espy MD,as directed by  AHollice Espy MD while in the presence of AHollice Espy MD.  I have reviewed the above documentation for accuracy and completeness, and I agree with the above.   AHollice Espy MD   [facility] [address] [place] [phone]

## 2022-01-22 ENCOUNTER — Encounter: Payer: Self-pay | Admitting: Urology

## 2022-03-19 DIAGNOSIS — Z9889 Other specified postprocedural states: Secondary | ICD-10-CM | POA: Diagnosis not present

## 2022-03-19 DIAGNOSIS — R29898 Other symptoms and signs involving the musculoskeletal system: Secondary | ICD-10-CM | POA: Diagnosis not present

## 2022-03-19 DIAGNOSIS — M25611 Stiffness of right shoulder, not elsewhere classified: Secondary | ICD-10-CM | POA: Diagnosis not present

## 2022-03-19 DIAGNOSIS — M25511 Pain in right shoulder: Secondary | ICD-10-CM | POA: Diagnosis not present

## 2022-03-19 DIAGNOSIS — M25411 Effusion, right shoulder: Secondary | ICD-10-CM | POA: Diagnosis not present

## 2022-03-26 DIAGNOSIS — M25511 Pain in right shoulder: Secondary | ICD-10-CM | POA: Diagnosis not present

## 2022-03-26 DIAGNOSIS — M25611 Stiffness of right shoulder, not elsewhere classified: Secondary | ICD-10-CM | POA: Diagnosis not present

## 2022-03-26 DIAGNOSIS — M25411 Effusion, right shoulder: Secondary | ICD-10-CM | POA: Diagnosis not present

## 2022-03-26 DIAGNOSIS — Z9889 Other specified postprocedural states: Secondary | ICD-10-CM | POA: Diagnosis not present

## 2022-03-26 DIAGNOSIS — R29898 Other symptoms and signs involving the musculoskeletal system: Secondary | ICD-10-CM | POA: Diagnosis not present

## 2022-04-02 DIAGNOSIS — M25611 Stiffness of right shoulder, not elsewhere classified: Secondary | ICD-10-CM | POA: Diagnosis not present

## 2022-04-02 DIAGNOSIS — M25411 Effusion, right shoulder: Secondary | ICD-10-CM | POA: Diagnosis not present

## 2022-04-02 DIAGNOSIS — M25511 Pain in right shoulder: Secondary | ICD-10-CM | POA: Diagnosis not present

## 2022-04-02 DIAGNOSIS — R29898 Other symptoms and signs involving the musculoskeletal system: Secondary | ICD-10-CM | POA: Diagnosis not present

## 2022-04-02 DIAGNOSIS — Z9889 Other specified postprocedural states: Secondary | ICD-10-CM | POA: Diagnosis not present

## 2022-04-09 DIAGNOSIS — Z9889 Other specified postprocedural states: Secondary | ICD-10-CM | POA: Diagnosis not present

## 2022-04-09 DIAGNOSIS — M25511 Pain in right shoulder: Secondary | ICD-10-CM | POA: Diagnosis not present

## 2022-04-09 DIAGNOSIS — M25411 Effusion, right shoulder: Secondary | ICD-10-CM | POA: Diagnosis not present

## 2022-04-09 DIAGNOSIS — M25611 Stiffness of right shoulder, not elsewhere classified: Secondary | ICD-10-CM | POA: Diagnosis not present

## 2022-04-09 DIAGNOSIS — R29898 Other symptoms and signs involving the musculoskeletal system: Secondary | ICD-10-CM | POA: Diagnosis not present

## 2022-04-14 DIAGNOSIS — M75121 Complete rotator cuff tear or rupture of right shoulder, not specified as traumatic: Secondary | ICD-10-CM | POA: Diagnosis not present

## 2022-04-14 DIAGNOSIS — M7501 Adhesive capsulitis of right shoulder: Secondary | ICD-10-CM | POA: Diagnosis not present

## 2022-04-14 DIAGNOSIS — S46101D Unspecified injury of muscle, fascia and tendon of long head of biceps, right arm, subsequent encounter: Secondary | ICD-10-CM | POA: Diagnosis not present

## 2022-04-14 DIAGNOSIS — M24111 Other articular cartilage disorders, right shoulder: Secondary | ICD-10-CM | POA: Diagnosis not present

## 2022-04-28 DIAGNOSIS — E785 Hyperlipidemia, unspecified: Secondary | ICD-10-CM | POA: Diagnosis not present

## 2022-04-28 DIAGNOSIS — Z8546 Personal history of malignant neoplasm of prostate: Secondary | ICD-10-CM | POA: Diagnosis not present

## 2022-04-28 DIAGNOSIS — E1169 Type 2 diabetes mellitus with other specified complication: Secondary | ICD-10-CM | POA: Diagnosis not present

## 2022-04-28 DIAGNOSIS — Z0000 Encounter for general adult medical examination without abnormal findings: Secondary | ICD-10-CM | POA: Diagnosis not present

## 2022-04-28 DIAGNOSIS — K219 Gastro-esophageal reflux disease without esophagitis: Secondary | ICD-10-CM | POA: Diagnosis not present

## 2022-04-28 DIAGNOSIS — M7501 Adhesive capsulitis of right shoulder: Secondary | ICD-10-CM | POA: Diagnosis not present

## 2022-04-28 DIAGNOSIS — M7502 Adhesive capsulitis of left shoulder: Secondary | ICD-10-CM | POA: Diagnosis not present

## 2022-04-28 DIAGNOSIS — Z794 Long term (current) use of insulin: Secondary | ICD-10-CM | POA: Diagnosis not present

## 2022-07-02 ENCOUNTER — Ambulatory Visit: Payer: Medicare PPO | Admitting: Urology

## 2022-07-02 VITALS — BP 143/79 | HR 67 | Ht 66.0 in | Wt 200.0 lb

## 2022-07-02 DIAGNOSIS — R310 Gross hematuria: Secondary | ICD-10-CM | POA: Diagnosis not present

## 2022-07-02 DIAGNOSIS — C61 Malignant neoplasm of prostate: Secondary | ICD-10-CM

## 2022-07-02 LAB — URINALYSIS, COMPLETE
Bilirubin, UA: NEGATIVE
Ketones, UA: NEGATIVE
Leukocytes,UA: NEGATIVE
Nitrite, UA: NEGATIVE
Protein,UA: NEGATIVE
RBC, UA: NEGATIVE
Specific Gravity, UA: 1.02 (ref 1.005–1.030)
Urobilinogen, Ur: 2 mg/dL — ABNORMAL HIGH (ref 0.2–1.0)
pH, UA: 5.5 (ref 5.0–7.5)

## 2022-07-02 LAB — MICROSCOPIC EXAMINATION

## 2022-07-02 NOTE — Progress Notes (Signed)
I, Keith Holland,acting as a scribe for Keith Espy, Keith Holland.,have documented all relevant documentation on the behalf of Keith Espy, Keith Holland,as directed by  Keith Espy, Keith Holland while in the presence of Keith Espy, Keith Holland.   07/02/22 3:03 PM   Keith Holland 1956-05-18 [id number]  Referring provider: Sofie Hartigan, Keith Holland [place],  [place] [address]  Chief Complaint  Patient presents with   Hematuria    HPI: 65 year-old male with a personal history of prostate, s/p prostatectomy, who presents today for gross hematuria.   Today's urinalysis is negative.  Today he complaints of gross hematuria after urination that started a couple of weeks ago. He denies any blood clots. He states hematuria was worse this past Saturday after heavy lifting on Friday. He denies any dysuria, changes with the urination.   He still has incontinence. He continues to wear Wiesner clamp daily.    PMH: Past Medical History:  Diagnosis Date   Anemia    Arthritis    Cancer ([place]) 2018   Prostate- December 30, 2669 removed   Complication of anesthesia    severe n/v   Diabetes mellitus without complication ([place])    Family history of adverse reaction to anesthesia    pts dads heart stopped while in PACU   GERD (gastroesophageal reflux disease)    Hyperlipidemia    PONV (postoperative nausea and vomiting)    SAH (subarachnoid hemorrhage) ([place])    spontaneous 2000 n surgery   Vertigo    since childhood if on back long will get vertigo     Surgical History: Past Surgical History:  Procedure Laterality Date   CLOSED MANIPULATION SHOULDER WITH STERIOD INJECTION Right 01/07/2022   Procedure: CLOSED MANIPULATION SHOULDER WITH STEROID INJECTION;  Surgeon: Corky Mull, Keith Holland;  Location: [facility];  Service: Orthopedics;  Laterality: Right;   COLONOSCOPY     2011, 2018, 2019, 2021   KNEE ARTHROSCOPY Left 01/10/2004   meniscus and patellar debridement   KNEE ARTHROSCOPY Right 11/2013   meniscus  repair   MENISCUS REPAIR Left    ROBOT ASSISTED LAPAROSCOPIC RADICAL PROSTATECTOMY N/A 12/29/2016   Procedure: ROBOTIC ASSISTED LAPAROSCOPIC RADICAL PROSTATECTOMY;  Surgeon: Keith Espy, Keith Holland;  Location: [facility];  Service: Urology;  Laterality: N/A;   SHOULDER ARTHROSCOPY WITH SUBACROMIAL DECOMPRESSION, ROTATOR CUFF REPAIR AND BICEP TENDON REPAIR Right 10/03/2021   Procedure: RIGHT SHOULDER ARTHROSCOPY WITH DEBRIDEMENT, DECOMPRESSION, ROTATOR CUFF REPAIR, AND BICEPS TENODESIS;  Surgeon: Corky Mull, Keith Holland;  Location: [facility];  Service: Orthopedics;  Laterality: Right;   TOTAL KNEE ARTHROPLASTY Left 02/02/2018   Procedure: TOTAL KNEE ARTHROPLASTY;  Surgeon: Hessie Knows, Keith Holland;  Location: [facility];  Service: Orthopedics;  Laterality: Left;    Home Medications:  Allergies as of 07/02/2022       Reactions   Percocet [oxycodone-acetaminophen] Nausea And Vomiting   Vicodin [hydrocodone-acetaminophen] Nausea And Vomiting, Other (See Comments)   Can tolerate with Phenergan        Medication List        Accurate as of July 02, 2022  3:03 PM. If you have any questions, ask your nurse or doctor.          STOP taking these medications    ibuprofen 200 MG tablet Commonly known as: ADVIL Stopped by: Keith Espy, Keith Holland   traMADol 50 MG tablet Commonly known as: Ultram Stopped by: Keith Espy, Keith Holland       TAKE these medications    B-D UF III MINI  PEN NEEDLES 31G X 5 MM Misc Generic drug: Insulin Pen Needle daily.   glipiZIDE 5 MG 24 hr tablet Commonly known as: GLUCOTROL XL Take 5 mg by mouth at bedtime.   insulin degludec 200 UNIT/ML FlexTouch Pen Commonly known as: TRESIBA Inject 30 Units into the skin at bedtime.   metFORMIN 500 MG 24 hr tablet Commonly known as: GLUCOPHAGE-XR Take 1,000 mg by mouth at bedtime.   omeprazole 20 MG capsule Commonly known as: PRILOSEC Take 20 mg by mouth at bedtime.   pioglitazone 30 MG tablet Commonly known as: ACTOS Take 30  mg by mouth every evening.   simvastatin 40 MG tablet Commonly known as: ZOCOR Take 40 mg by mouth every evening.        Allergies:  Allergies  Allergen Reactions   Percocet [Oxycodone-Acetaminophen] Nausea And Vomiting   Vicodin [Hydrocodone-Acetaminophen] Nausea And Vomiting and Other (See Comments)    Can tolerate with Phenergan    Family History: Family History  Problem Relation Age of Onset   Prostate cancer Father    Stroke Father    Stroke Mother    Bladder Cancer Neg Hx    Kidney cancer Neg Hx     Social History:  reports that he has quit smoking. His smoking use included cigarettes and cigars. He quit smokeless tobacco use about 32 years ago.  His smokeless tobacco use included chew. He reports current alcohol use. He reports that he does not use drugs.   Physical Exam: BP (!) 143/79   Pulse 67   Ht '5\' 6"'$  (1.676 m)   Wt 200 lb (90.7 kg)   BMI 32.28 kg/m   Constitutional:  Alert and oriented, No acute distress. HEENT: [place] AT, moist mucus membranes.  Trachea midline, no masses. Neurologic: Grossly intact, no focal deficits, moving all 4 extremities. Psychiatric: Normal mood and affect.  Results for orders placed or performed in visit on 07/02/22  Microscopic Examination   Urine  Result Value Ref Range   WBC, UA 0-5 0 - 5 /hpf   RBC, Urine 0-2 0 - 2 /hpf   Epithelial Cells (non renal) 0-10 0 - 10 /hpf   Bacteria, UA Few None seen/Few  Urinalysis, Complete  Result Value Ref Range   Specific Gravity, UA 1.020 1.005 - 1.030   pH, UA 5.5 5.0 - 7.5   Color, UA Yellow Yellow   Appearance Ur Clear Clear   Leukocytes,UA Negative Negative   Protein,UA Negative Negative/Trace   Glucose, UA 2+ (A) Negative   Ketones, UA Negative Negative   RBC, UA Negative Negative   Bilirubin, UA Negative Negative   Urobilinogen, Ur 2.0 (H) 0.2 - 1.0 mg/dL   Nitrite, UA Negative Negative   Microscopic Examination See below:      Assessment & Plan:    Terminal  hematuria - Suspect its likely lower urinary tract in nature. We'll proceed with a cystoscopy, hold off on any further imaging for the time being. We discussed warning symptoms, encouraged adequate hydration.   -UA today is negative, no evidence of infection  2. Prostate cancer - Due for PSA in January.   F/u cysto  [facility] [address], [place], [place] [address] [phone]   I have reviewed the above documentation for accuracy and completeness, and I agree with the above.   Keith Espy, Keith Holland

## 2022-07-02 NOTE — Patient Instructions (Signed)

## 2022-07-21 DIAGNOSIS — E1169 Type 2 diabetes mellitus with other specified complication: Secondary | ICD-10-CM | POA: Diagnosis not present

## 2022-07-24 ENCOUNTER — Other Ambulatory Visit: Payer: Medicare PPO

## 2022-07-24 DIAGNOSIS — C61 Malignant neoplasm of prostate: Secondary | ICD-10-CM

## 2022-07-25 ENCOUNTER — Other Ambulatory Visit: Payer: Medicare PPO | Admitting: Urology

## 2022-07-25 LAB — PSA: Prostate Specific Ag, Serum: <0.1 ng/mL (ref 0.0–4.0)

## 2022-10-09 DIAGNOSIS — E1169 Type 2 diabetes mellitus with other specified complication: Secondary | ICD-10-CM | POA: Diagnosis not present

## 2022-10-09 DIAGNOSIS — E785 Hyperlipidemia, unspecified: Secondary | ICD-10-CM | POA: Diagnosis not present

## 2022-10-09 DIAGNOSIS — Z794 Long term (current) use of insulin: Secondary | ICD-10-CM | POA: Diagnosis not present

## 2022-10-13 DIAGNOSIS — E785 Hyperlipidemia, unspecified: Secondary | ICD-10-CM | POA: Diagnosis not present

## 2022-10-13 DIAGNOSIS — K219 Gastro-esophageal reflux disease without esophagitis: Secondary | ICD-10-CM | POA: Diagnosis not present

## 2022-10-13 DIAGNOSIS — M7502 Adhesive capsulitis of left shoulder: Secondary | ICD-10-CM | POA: Diagnosis not present

## 2022-10-13 DIAGNOSIS — Z794 Long term (current) use of insulin: Secondary | ICD-10-CM | POA: Diagnosis not present

## 2022-10-13 DIAGNOSIS — Z8546 Personal history of malignant neoplasm of prostate: Secondary | ICD-10-CM | POA: Diagnosis not present

## 2022-10-13 DIAGNOSIS — M7501 Adhesive capsulitis of right shoulder: Secondary | ICD-10-CM | POA: Diagnosis not present

## 2022-10-13 DIAGNOSIS — E1169 Type 2 diabetes mellitus with other specified complication: Secondary | ICD-10-CM | POA: Diagnosis not present

## 2022-10-19 DIAGNOSIS — E1169 Type 2 diabetes mellitus with other specified complication: Secondary | ICD-10-CM | POA: Diagnosis not present

## 2023-01-17 DIAGNOSIS — E1169 Type 2 diabetes mellitus with other specified complication: Secondary | ICD-10-CM | POA: Diagnosis not present

## 2023-02-16 ENCOUNTER — Ambulatory Visit
Admission: EM | Admit: 2023-02-16 | Discharge: 2023-02-16 | Disposition: A | Discharge status: Home / Self Care | Payer: Medicare PPO | Attending: Urgent Care | Admitting: Urgent Care

## 2023-02-16 DIAGNOSIS — U071 COVID-19: Secondary | ICD-10-CM | POA: Diagnosis not present

## 2023-02-16 DIAGNOSIS — J029 Acute pharyngitis, unspecified: Secondary | ICD-10-CM | POA: Diagnosis not present

## 2023-02-16 DIAGNOSIS — R0981 Nasal congestion: Secondary | ICD-10-CM

## 2023-02-16 LAB — GROUP A STREP BY PCR: Group A Strep by PCR: NOT DETECTED

## 2023-02-16 LAB — SARS CORONAVIRUS 2 BY RT PCR: SARS Coronavirus 2 by RT PCR: POSITIVE — AB

## 2023-02-16 MED ORDER — MOLNUPIRAVIR 200 MG PO CAPS: 4.0000 | ORAL_CAPSULE | Freq: Two times a day (BID) | ORAL | 0 refills | Status: AC

## 2023-02-16 NOTE — ED Triage Notes (Signed)
Patient states that he has had sinus pressure/sinus drainage h/a and sore throat for 2 days. Hasn't taken anything.

## 2023-02-16 NOTE — ED Provider Notes (Signed)
[facility]    CSN: [id number] Arrival date & time: 02/16/23  1018      History   Chief Complaint Chief Complaint  Patient presents with   Facial Pain   Sore Throat   Headache    HPI Keith Holland is a 67 y.o. male.   Pleasant 67 year old male presents today due to concerns of facial pain, sinus headache, sore throat, and a dry cough.  States a mild headache started roughly 1 week ago, but then on Saturday his symptoms worsened progressively.  Saturday he also developed a severe sore throat.  He denies a fever, ear pain but does endorse some nasal congestion and sneezing.  Has tried over-the-counter medication without symptom improvement.    Sore Throat Associated symptoms include headaches.  Headache   Past Medical History:  Diagnosis Date   Anemia    Arthritis    Cancer (HCC) 2018   Prostate- December 30, 2016 removed   Complication of anesthesia    severe n/v   Diabetes mellitus without complication (HCC)    Family history of adverse reaction to anesthesia    pts dads heart stopped while in PACU   GERD (gastroesophageal reflux disease)    Hyperlipidemia    PONV (postoperative nausea and vomiting)    SAH (subarachnoid hemorrhage) (HCC)    spontaneous 2000 n surgery   Vertigo    since childhood if on back long will get vertigo     Patient Active Problem List   Diagnosis Date Noted   Status post total knee replacement using cement, 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   Prostate cancer (HCC) 11/30/2016   Elevated PSA 10/13/2016    Past Surgical History:  Procedure Laterality Date   CLOSED MANIPULATION SHOULDER WITH STERIOD INJECTION Right 01/07/2022   Procedure: CLOSED MANIPULATION SHOULDER WITH STEROID INJECTION;  Surgeon: Christena Flake, MD;  Location: [facility];  Service: Orthopedics;  Laterality: Right;   COLONOSCOPY     2011, 2018, 2019, 2021   KNEE ARTHROSCOPY Left 01/10/2004   meniscus and patellar debridement   KNEE ARTHROSCOPY Right 11/2013   meniscus  repair   MENISCUS REPAIR Left    ROBOT ASSISTED LAPAROSCOPIC RADICAL PROSTATECTOMY N/A 12/29/2016   Procedure: ROBOTIC ASSISTED LAPAROSCOPIC RADICAL PROSTATECTOMY;  Surgeon: Vanna Scotland, MD;  Location: [facility];  Service: Urology;  Laterality: N/A;   SHOULDER ARTHROSCOPY WITH SUBACROMIAL DECOMPRESSION, ROTATOR CUFF REPAIR AND BICEP TENDON REPAIR Right 10/03/2021   Procedure: RIGHT SHOULDER ARTHROSCOPY WITH DEBRIDEMENT, DECOMPRESSION, ROTATOR CUFF REPAIR, AND BICEPS TENODESIS;  Surgeon: Christena Flake, MD;  Location: [facility];  Service: Orthopedics;  Laterality: Right;   TOTAL KNEE ARTHROPLASTY Left 02/02/2018   Procedure: TOTAL KNEE ARTHROPLASTY;  Surgeon: Kennedy Bucker, MD;  Location: [facility];  Service: Orthopedics;  Laterality: Left;       Home Medications    Prior to Admission medications   Medication Sig Start Date End Date Taking? Authorizing Provider  B-D UF III MINI PEN NEEDLES 31G X 5 MM MISC daily. 06/11/22  Yes [provider]  glipiZIDE (GLUCOTROL XL) 5 MG 24 hr tablet Take 5 mg by mouth at bedtime. 08/06/21  Yes [provider]  insulin degludec (TRESIBA) 200 UNIT/ML FlexTouch Pen Inject 30 Units into the skin at bedtime. 05/11/17  Yes [provider]  metFORMIN (GLUCOPHAGE-XR) 500 MG 24 hr tablet Take 1,000 mg by mouth at bedtime.  01/16/16  Yes [provider]  molnupiravir EUA (LAGEVRIO) 200 MG CAPS capsule Take 4 capsules (800 mg total)  by mouth 2 (two) times daily for 5 days. 02/16/23 02/21/23 Yes ,  L, PA  omeprazole (PRILOSEC) 20 MG capsule Take 20 mg by mouth at bedtime. 09/17/16  Yes [provider]  pioglitazone (ACTOS) 30 MG tablet Take 30 mg by mouth every evening.  01/16/16  Yes [provider]  simvastatin (ZOCOR) 40 MG tablet Take 40 mg by mouth every evening.  09/17/16  Yes [provider]    Family History Family History  Problem Relation Age of Onset   Prostate cancer Father    Stroke  Father    Stroke Mother    Bladder Cancer Neg Hx    Kidney cancer Neg Hx     Social History Social History   Tobacco Use   Smoking status: Former    Types: Cigarettes, Cigars   Smokeless tobacco: Former    Types: Chew    Quit date: 1991  Vaping Use   Vaping status: Never Used  Substance Use Topics   Alcohol use: Yes    Comment: rare   Drug use: No     Allergies   Percocet [oxycodone-acetaminophen] and Vicodin [hydrocodone-acetaminophen]   Review of Systems Review of Systems  Neurological:  Positive for headaches.  As per HPI   Physical Exam Triage Vital Signs ED Triage Vitals [02/16/23 1116]  Encounter Vitals Group     BP 133/77     Systolic BP Percentile      Diastolic BP Percentile      Pulse Rate 63     Resp 17     Temp 98.9 F (37.2 C)     Temp Source Oral     SpO2 95 %     Weight 190 lb (86.2 kg)     Height      Head Circumference      Peak Flow      Pain Score 8     Pain Loc      Pain Education      Exclude from Growth Chart    No data found.  Updated Vital Signs BP 133/77 (BP Location: Right Arm)   Pulse 63   Temp 98.9 F (37.2 C) (Oral)   Resp 17   Wt 190 lb (86.2 kg)   SpO2 95%   BMI 30.67 kg/m   Visual Acuity Right Eye Distance:   Left Eye Distance:   Bilateral Distance:    Right Eye Near:   Left Eye Near:    Bilateral Near:     Physical Exam Vitals and nursing note reviewed.  Constitutional:      General: He is not in acute distress.    Appearance: He is well-developed and normal weight. He is not ill-appearing, toxic-appearing or diaphoretic.  HENT:     Head: Normocephalic and atraumatic.     Right Ear: Tympanic membrane and ear canal normal. No drainage, swelling or tenderness. No middle ear effusion. Tympanic membrane is not erythematous.     Left Ear: Tympanic membrane and ear canal normal. No drainage, swelling or tenderness.  No middle ear effusion. Tympanic membrane is not erythematous.     Nose: No congestion or  rhinorrhea.     Right Sinus: No maxillary sinus tenderness or frontal sinus tenderness.     Left Sinus: No maxillary sinus tenderness or frontal sinus tenderness.     Mouth/Throat:     Mouth: Mucous membranes are moist. No oral lesions.     Pharynx: Oropharynx is clear. Uvula midline. Posterior oropharyngeal erythema (  minimal) present. No pharyngeal swelling, oropharyngeal exudate or uvula swelling.     Tonsils: No tonsillar exudate or tonsillar abscesses.  Eyes:     Extraocular Movements:     Right eye: Normal extraocular motion.     Left eye: Normal extraocular motion.     Conjunctiva/sclera: Conjunctivae normal.     Pupils: Pupils are equal, round, and reactive to light.  Cardiovascular:     Rate and Rhythm: Normal rate and regular rhythm.  Pulmonary:     Effort: Pulmonary effort is normal. No respiratory distress.     Breath sounds: Normal breath sounds. No stridor. No rhonchi.  Abdominal:     Palpations: Abdomen is soft.  Musculoskeletal:     Cervical back: Normal range of motion and neck supple.  Lymphadenopathy:     Cervical: No cervical adenopathy.  Skin:    General: Skin is warm and dry.     Findings: No erythema or rash.  Neurological:     General: No focal deficit present.     Mental Status: He is alert and oriented to person, place, and time.  Psychiatric:        Mood and Affect: Mood normal.        Behavior: Behavior normal.      UC Treatments / Results  Labs (all labs ordered are listed, but only abnormal results are displayed) Labs Reviewed  SARS CORONAVIRUS 2 BY RT PCR - Abnormal; Notable for the following components:      Result Value   SARS Coronavirus 2 by RT PCR POSITIVE (*)    All other components within normal limits  GROUP A STREP BY PCR    EKG   Radiology No results found.  Procedures Procedures (including critical care time)  Medications Ordered in UC Medications - No data to display  Initial Impression / Assessment and Plan / UC  Course  I have reviewed the triage vital signs and the nursing notes.  Pertinent labs & imaging results that were available during my care of the patient were reviewed by me and considered in my medical decision making (see chart for details).     Sore throat - rapid strep negative. Covid test was obtained Sinus congestion - pt does not have reproducible pain, do not suspect bacterial infection. Pt left after covid swab (around 12:20pm), requesting phone call with results. Covid-19 - results obtained and patient called. VM left. Pt is positive for covid, which is the cause of #1 and #2. Pt cannot take paxlovid due to his statin. Will send in Rx for molnupiravir x 5 days.    Final Clinical Impressions(s) / UC Diagnoses   Final diagnoses:  Sore throat  Sinus congestion  COVID-19     Discharge Instructions      Your strep test is negative. I will call you with results of your COVID swab to discuss treatment options and plan.  **Addendum* Pt was called at 1:07pm with the results of covid swab. VM left for patient to call back. Result is positive. Pt cannot take paxlovid due to his cholesterol medication. Will therefore call in molnupiravir. Pt to take 4 caps BID x 5 days.     ED Prescriptions     Medication Sig Dispense Auth. Provider   molnupiravir EUA (LAGEVRIO) 200 MG CAPS capsule Take 4 capsules (800 mg total) by mouth 2 (two) times daily for 5 days. 40 capsule ,  L, PA      PDMP not reviewed this encounter.  Maretta Bees, [place] 02/16/23 1312

## 2023-02-16 NOTE — Discharge Instructions (Addendum)
Your strep test is negative. I will call you with results of your COVID swab to discuss treatment options and plan.  **Addendum* Pt was called at 1:07pm with the results of covid swab. VM left for patient to call back. Result is positive. Pt cannot take paxlovid due to his cholesterol medication. Will therefore call in molnupiravir. Pt to take 4 caps BID x 5 days.

## 2023-05-07 DIAGNOSIS — Z794 Long term (current) use of insulin: Secondary | ICD-10-CM | POA: Diagnosis not present

## 2023-05-07 DIAGNOSIS — Z8546 Personal history of malignant neoplasm of prostate: Secondary | ICD-10-CM | POA: Diagnosis not present

## 2023-05-07 DIAGNOSIS — E1169 Type 2 diabetes mellitus with other specified complication: Secondary | ICD-10-CM | POA: Diagnosis not present

## 2023-05-07 DIAGNOSIS — M7501 Adhesive capsulitis of right shoulder: Secondary | ICD-10-CM | POA: Diagnosis not present

## 2023-05-07 DIAGNOSIS — M7502 Adhesive capsulitis of left shoulder: Secondary | ICD-10-CM | POA: Diagnosis not present

## 2023-05-07 DIAGNOSIS — E785 Hyperlipidemia, unspecified: Secondary | ICD-10-CM | POA: Diagnosis not present

## 2023-05-07 DIAGNOSIS — K219 Gastro-esophageal reflux disease without esophagitis: Secondary | ICD-10-CM | POA: Diagnosis not present

## 2023-05-07 DIAGNOSIS — Z0000 Encounter for general adult medical examination without abnormal findings: Secondary | ICD-10-CM | POA: Diagnosis not present

## 2023-05-08 DIAGNOSIS — E1169 Type 2 diabetes mellitus with other specified complication: Secondary | ICD-10-CM | POA: Diagnosis not present

## 2023-05-11 DIAGNOSIS — E1169 Type 2 diabetes mellitus with other specified complication: Secondary | ICD-10-CM | POA: Diagnosis not present

## 2023-05-11 DIAGNOSIS — E785 Hyperlipidemia, unspecified: Secondary | ICD-10-CM | POA: Diagnosis not present

## 2023-05-11 DIAGNOSIS — Z8546 Personal history of malignant neoplasm of prostate: Secondary | ICD-10-CM | POA: Diagnosis not present

## 2023-05-11 DIAGNOSIS — Z794 Long term (current) use of insulin: Secondary | ICD-10-CM | POA: Diagnosis not present

## 2023-08-06 DIAGNOSIS — E1169 Type 2 diabetes mellitus with other specified complication: Secondary | ICD-10-CM | POA: Diagnosis not present

## 2023-08-20 DIAGNOSIS — H1131 Conjunctival hemorrhage, right eye: Secondary | ICD-10-CM | POA: Diagnosis not present

## 2023-11-10 DIAGNOSIS — M7501 Adhesive capsulitis of right shoulder: Secondary | ICD-10-CM | POA: Diagnosis not present

## 2023-11-10 DIAGNOSIS — E1169 Type 2 diabetes mellitus with other specified complication: Secondary | ICD-10-CM | POA: Diagnosis not present

## 2023-11-10 DIAGNOSIS — Z0000 Encounter for general adult medical examination without abnormal findings: Secondary | ICD-10-CM | POA: Diagnosis not present

## 2023-11-10 DIAGNOSIS — K219 Gastro-esophageal reflux disease without esophagitis: Secondary | ICD-10-CM | POA: Diagnosis not present

## 2023-11-10 DIAGNOSIS — Z794 Long term (current) use of insulin: Secondary | ICD-10-CM | POA: Diagnosis not present

## 2023-11-10 DIAGNOSIS — M7502 Adhesive capsulitis of left shoulder: Secondary | ICD-10-CM | POA: Diagnosis not present

## 2023-11-10 DIAGNOSIS — M79645 Pain in left finger(s): Secondary | ICD-10-CM | POA: Diagnosis not present

## 2023-11-10 DIAGNOSIS — E785 Hyperlipidemia, unspecified: Secondary | ICD-10-CM | POA: Diagnosis not present

## 2023-11-11 DIAGNOSIS — E1169 Type 2 diabetes mellitus with other specified complication: Secondary | ICD-10-CM | POA: Diagnosis not present

## 2023-11-11 DIAGNOSIS — M7501 Adhesive capsulitis of right shoulder: Secondary | ICD-10-CM | POA: Diagnosis not present

## 2023-11-11 DIAGNOSIS — Z794 Long term (current) use of insulin: Secondary | ICD-10-CM | POA: Diagnosis not present

## 2023-11-11 DIAGNOSIS — E785 Hyperlipidemia, unspecified: Secondary | ICD-10-CM | POA: Diagnosis not present

## 2023-11-11 DIAGNOSIS — M7502 Adhesive capsulitis of left shoulder: Secondary | ICD-10-CM | POA: Diagnosis not present

## 2023-11-24 DIAGNOSIS — M20012 Mallet finger of left finger(s): Secondary | ICD-10-CM | POA: Diagnosis not present

## 2024-02-04 DIAGNOSIS — E1169 Type 2 diabetes mellitus with other specified complication: Secondary | ICD-10-CM | POA: Diagnosis not present

## 2024-02-04 DIAGNOSIS — E785 Hyperlipidemia, unspecified: Secondary | ICD-10-CM | POA: Diagnosis not present

## 2024-02-04 DIAGNOSIS — Z794 Long term (current) use of insulin: Secondary | ICD-10-CM | POA: Diagnosis not present

## 2024-02-04 DIAGNOSIS — K219 Gastro-esophageal reflux disease without esophagitis: Secondary | ICD-10-CM | POA: Diagnosis not present

## 2024-02-09 DIAGNOSIS — E1169 Type 2 diabetes mellitus with other specified complication: Secondary | ICD-10-CM | POA: Diagnosis not present

## 2024-02-10 DIAGNOSIS — Z794 Long term (current) use of insulin: Secondary | ICD-10-CM | POA: Diagnosis not present

## 2024-02-10 DIAGNOSIS — Z1331 Encounter for screening for depression: Secondary | ICD-10-CM | POA: Diagnosis not present

## 2024-02-10 DIAGNOSIS — M79675 Pain in left toe(s): Secondary | ICD-10-CM | POA: Diagnosis not present

## 2024-02-10 DIAGNOSIS — E119 Type 2 diabetes mellitus without complications: Secondary | ICD-10-CM | POA: Diagnosis not present

## 2024-05-09 DIAGNOSIS — E1169 Type 2 diabetes mellitus with other specified complication: Secondary | ICD-10-CM | POA: Diagnosis not present

## 2024-05-17 DIAGNOSIS — Z8546 Personal history of malignant neoplasm of prostate: Secondary | ICD-10-CM | POA: Diagnosis not present

## 2024-05-17 DIAGNOSIS — Z794 Long term (current) use of insulin: Secondary | ICD-10-CM | POA: Diagnosis not present

## 2024-05-17 DIAGNOSIS — K219 Gastro-esophageal reflux disease without esophagitis: Secondary | ICD-10-CM | POA: Diagnosis not present

## 2024-05-17 DIAGNOSIS — M7501 Adhesive capsulitis of right shoulder: Secondary | ICD-10-CM | POA: Diagnosis not present

## 2024-05-17 DIAGNOSIS — E1169 Type 2 diabetes mellitus with other specified complication: Secondary | ICD-10-CM | POA: Diagnosis not present

## 2024-05-17 DIAGNOSIS — M7502 Adhesive capsulitis of left shoulder: Secondary | ICD-10-CM | POA: Diagnosis not present

## 2024-05-17 DIAGNOSIS — E785 Hyperlipidemia, unspecified: Secondary | ICD-10-CM | POA: Diagnosis not present
